# Patient Record
Sex: Male | Born: 1970 | Race: White | Hispanic: Yes | Marital: Married | Smoking: Never smoker
Health system: Southern US, Community
[De-identification: ages and names within clinical notes are randomized; demographics above are authoritative.]

## PROBLEM LIST (undated history)

## (undated) HISTORY — PX: APPENDECTOMY: SHX54

---

## 2003-07-09 ENCOUNTER — Emergency Department (HOSPITAL_COMMUNITY): Admission: EM | Admit: 2003-07-09 | Discharge: 2003-07-09 | Payer: Self-pay | Admitting: Emergency Medicine

## 2003-07-09 ENCOUNTER — Encounter: Payer: Self-pay | Admitting: Emergency Medicine

## 2004-01-17 ENCOUNTER — Emergency Department (HOSPITAL_COMMUNITY): Admission: EM | Admit: 2004-01-17 | Discharge: 2004-01-17 | Payer: Self-pay | Admitting: Emergency Medicine

## 2004-10-11 ENCOUNTER — Emergency Department (HOSPITAL_COMMUNITY): Admission: EM | Admit: 2004-10-11 | Discharge: 2004-10-11 | Payer: Self-pay | Admitting: Emergency Medicine

## 2005-11-08 ENCOUNTER — Emergency Department (HOSPITAL_COMMUNITY): Admission: EM | Admit: 2005-11-08 | Discharge: 2005-11-08 | Payer: Self-pay | Admitting: Emergency Medicine

## 2011-06-09 ENCOUNTER — Emergency Department (HOSPITAL_COMMUNITY)
Admission: EM | Admit: 2011-06-09 | Discharge: 2011-06-09 | Disposition: A | Discharge status: Home / Self Care | Payer: BC Managed Care – PPO | Attending: Emergency Medicine | Admitting: Emergency Medicine

## 2011-06-09 DIAGNOSIS — R112 Nausea with vomiting, unspecified: Secondary | ICD-10-CM | POA: Insufficient documentation

## 2011-06-09 DIAGNOSIS — R42 Dizziness and giddiness: Secondary | ICD-10-CM | POA: Insufficient documentation

## 2011-06-09 DIAGNOSIS — K2970 Gastritis, unspecified, without bleeding: Secondary | ICD-10-CM | POA: Insufficient documentation

## 2011-06-09 DIAGNOSIS — K2990 Gastroduodenitis, unspecified, without bleeding: Secondary | ICD-10-CM | POA: Insufficient documentation

## 2011-06-09 DIAGNOSIS — R61 Generalized hyperhidrosis: Secondary | ICD-10-CM | POA: Insufficient documentation

## 2011-06-09 DIAGNOSIS — R1012 Left upper quadrant pain: Secondary | ICD-10-CM | POA: Insufficient documentation

## 2014-06-09 ENCOUNTER — Emergency Department (HOSPITAL_COMMUNITY)
Admission: EM | Admit: 2014-06-09 | Discharge: 2014-06-10 | Disposition: A | Discharge status: Home / Self Care | Payer: Commercial Managed Care - PPO | Attending: Emergency Medicine | Admitting: Emergency Medicine

## 2014-06-09 ENCOUNTER — Encounter (HOSPITAL_COMMUNITY): Payer: Self-pay | Admitting: Emergency Medicine

## 2014-06-09 DIAGNOSIS — R0789 Other chest pain: Secondary | ICD-10-CM

## 2014-06-09 DIAGNOSIS — Z79899 Other long term (current) drug therapy: Secondary | ICD-10-CM | POA: Insufficient documentation

## 2014-06-09 DIAGNOSIS — H81399 Other peripheral vertigo, unspecified ear: Secondary | ICD-10-CM | POA: Insufficient documentation

## 2014-06-09 LAB — BASIC METABOLIC PANEL
BUN: 14 mg/dL (ref 6–23)
CHLORIDE: 102 meq/L (ref 96–112)
CO2: 26 mEq/L (ref 19–32)
CREATININE: 0.93 mg/dL (ref 0.50–1.35)
Calcium: 10.3 mg/dL (ref 8.4–10.5)
Glucose, Bld: 93 mg/dL (ref 70–99)
POTASSIUM: 4.5 meq/L (ref 3.7–5.3)
Sodium: 140 mEq/L (ref 137–147)

## 2014-06-09 LAB — I-STAT CHEM 8, ED
BUN: 14 mg/dL (ref 6–23)
CALCIUM ION: 1.23 mmol/L (ref 1.12–1.23)
Chloride: 104 mEq/L (ref 96–112)
Creatinine, Ser: 1 mg/dL (ref 0.50–1.35)
Glucose, Bld: 93 mg/dL (ref 70–99)
HEMATOCRIT: 52 % (ref 39.0–52.0)
Hemoglobin: 17.7 g/dL — ABNORMAL HIGH (ref 13.0–17.0)
Potassium: 4.1 mEq/L (ref 3.7–5.3)
Sodium: 141 mEq/L (ref 137–147)
TCO2: 24 mmol/L (ref 0–100)

## 2014-06-09 LAB — CBG MONITORING, ED: GLUCOSE-CAPILLARY: 98 mg/dL (ref 70–99)

## 2014-06-09 NOTE — ED Notes (Signed)
Patient says he has been dizzy for about a day.  He lays down and it gets better.  His dizziness got worse and he started having left rib pain so he decided to come.  The patient denies SOB, N/V, diarrhea, but is complaining of some left rib pain and diaphoresis.  His dizziness improves when he lays down.  The patient came to the ED because he started having rib pain along with the dizziness.

## 2014-06-10 MED ORDER — NAPROXEN 500 MG PO TABS: 500.0000 mg | ORAL_TABLET | Freq: Two times a day (BID) | ORAL | Status: DC

## 2014-06-10 MED ORDER — MECLIZINE HCL 25 MG PO TABS
25.0000 mg | ORAL_TABLET | Freq: Once | ORAL | Status: AC
  Administered 2014-06-10: 25 mg via ORAL
  Filled 2014-06-10: qty 1

## 2014-06-10 MED ORDER — MECLIZINE HCL 25 MG PO TABS: 25.0000 mg | ORAL_TABLET | Freq: Three times a day (TID) | ORAL | Status: DC | PRN

## 2014-06-10 NOTE — Discharge Instructions (Signed)
Vrtigo (Vertigo)  Vrtigo es la sensacin de que se est moviendo estando quieto. Puede ser peligroso si ocurre cuando est trabajado, conduciendo vehculos o realizando actividades difciles.  CAUSAS  El vrtigo se produce cuando hay un conflicto en las seales que se envan al cerebro desde los sistemas visual y sensorial del cuerpo. Hay numerosas causas que [employer], entre las que se incluyen:   Infecciones, especialmente en el odo interno.  Nelia Shi reaccin a un medicamento o mal uso de alcohol y frmacos.  Abstinencia de drogas o alcohol.  Cambios rpidos de posicin, como al [employer] o darse vuelta en la cama.  Dolor de Surveyor, minerals.  Disminucin del flujo sanguneo hacia el cerebro.  Aumento de la presin en el cerebro por un traumatismo, infeccin, tumor o sangrado en la cabeza. SNTOMAS  Puede sentir como si el mundo da vueltas o va a caer al piso. Como hay problemas en el equilibrio, el vrtigo puede causar nuseas y vmitos. Tiene movimientos oculares involuntarios (nistagmus).  DIAGNSTICO  El vrtigo normalmente se diagnostica con un examen fsico. Si la causa no se conoce, el mdico puede indicar diagnstico por imgenes, como una resonancia magntica (imgenes por Health visitor).  TRATAMIENTO  La mayor parte de los casos de vrtigo se resuelve sin TEFL teacher. Segn la causa, el mdico podr recetar ciertos medicamentos. Si se relaciona con la posicin del cuerpo, podr recomendarle movimientos o procedimientos para corregir el problema. En algunos casos raros, si la causa del vrtigo es un problema en el odo interno, necesitar [place].  INSTRUCCIONES PARA EL CUIDADO DOMICILIARIO  Siga las indicaciones del mdico.  Evite conducir vehculos.  Evite operar maquinarias pesadas.  Evite realizar tareas que seran peligrosas para usted u otras personas durante un episodio de vrtigo.  Comunquele al mdico si nota que ciertos medicamentos  parecen asociarse con las crisis. Algunos medicamentos que se usan para tratar los episodios, en [employer]. SOLICITE ATENCIN MDICA DE INMEDIATO SI:  Los medicamentos no [place] las crisis o hacen que estas empeoren.  Tiene dificultad para hablar, caminar, siente debilidad o tiene problemas para [employer], las manos o las piernas.  Comienza a sufrir un dolor de cabeza intenso.  Las nuseas y los vmitos no se [place] o se Press photographer.  Aparecen trastornos visuales.  Un miembro de su familia nota cambios en su conducta.  Hay alguna modificacin en su trastorno que parece Holiday representative de Scientist, clinical (histocompatibility and immunogenetics). ASEGRESE DE QUE:   Comprende estas instrucciones.  Controlar su enfermedad.  Solicitar ayuda de inmediato si no mejora o si empeora. Document Released: 09/25/2005 Document Revised: 03/09/2012 [facility] Patient Information 2014 [place], [place].  Dolor en el pecho (inespecfico) (Chest Pain, Nonspecific) Frecuentemente es difcil dar un diagnstico especfico de la causa de un dolor en el pecho. Siempre existe una posibilidad de que el dolor est relacionado con algo ms grave como un ataque cardaco o un cogulo en los pulmones. Necesitar realizar un seguimiento con el mdico para Catering manager.  CAUSAS  Acidez.  Neumona o bronquitis.  Ansiedad o estrs.  Una inflamacin de la zona que rodea al corazn (pericarditis) o a los pulmones (pleuritis, pleuresa).  Un cogulo sanguneo en el pulmn.  Pulmones colapsados (neumotrax). Puede aparecer de Gus Height repentina por s solo (neumotrax espontneo) o por un traumatismo en el pecho.  Culebrilla (virus del herpes zster). Las paredes del pecho estn compuestas de [place], msculos y Dietitian. Cualquiera de estos puede ser fuente del dolor.  Puede haber una contusin en los huesos debido a una lesin.  Puede haber un esguince en los msculos o el cartlago ocasionado por la tos o un  esfuerzo.  El cartlago tambin puede verse afectado por una inflamacin y Engineer, agricultural (costocondritis). DIAGNSTICO Puede ser necesario realizar anlisis de laboratorio u otros estudios tales como radiografas, Materials engineer, prueba de esfuerzo, o diagnstico por imgenes para el corazn para determinar la causa de su dolor.  TRATAMIENTO  El tratamiento depender de la causa que provoque el dolor en el pecho. El tratamiento pueden incluir:  Bloqueadores de cido para la acidez estomacal.  Medicamentos antiinflamatorios.  Analgsicos para las enfermedades inflamatorias.  Antibiticos si existe infeccin.  Podrn aconsejarle que modifique su estilo de vida. Esto incluye dejar de fumar y evitar el alcohol, la cafena y el chocolate.  Se le aconsejar que duerma con la cabeza levantada [place]). Esto reduce la probabilidad de que el cido vuelva hacia atrs desde el estmago hasta el esfago.  La mayora de las veces, Chief Technology Officer en el pecho no especfico mejora dentro de los 2 a 3 das con reposo y medicamentos para Technical sales engineer. INSTRUCCIONES PARA EL CUIDADO DOMICILIARIO  Si le prescriben antibiticos, tmelos tal como se le indic. Tmelos todos, aunque se sienta mejor.  Durante los [address], evite la actividad fsica que le hace Civil engineer, contracting. Contine con las actividades fsicas tal como se le indic.  No fume.  Evite consumir alcohol.  Slo tome medicamentos de Sales promotion account executive o prescriptos para Primary school teacher, las molestias o bajar la fiebre segn las indicaciones de su mdico.  Siga las indicaciones del profesional que lo asiste para un mayor control si los problemas persisten.  Cumpla con todas las visitas de control programadas. Si no lo hace, podr desarrollar problemas permanentes (crnicos) relacionados con el dolor. Si tiene algn problema para asistir a la cita, debe comunicarse con el establecimiento para obtener asistencia. SOLICITE ATENCIN MDICA  SI:  Tiene problemas que considere que pueden ser efectos secundarios de los medicamentos que toma. Lea con cuidado las recomendaciones para su medicacin.  El dolor en el pecho contina incluso despus de haber seguido el tratamiento.  Observa una erupcin en el pecho, que presenta ampollas. SOLICITE ATENCIN MDICA DE INMEDIATO SI:  El dolor en el pecho aumenta o se extiende al brazo, cuello, mandbula, espalda o abdomen.  Le falta el aire, aumenta la tos o tose y [place].  Tiene dolor intenso en la espalda o el abdomen, nuseas o vmitos.  Presenta debilidad, desmayos o escalofros.  Tiene fiebre. ESTO ES UNA EMERGENCIA. No espere a que el dolor se vaya. Pida ayuda mdica de inmediato. Comunquese con el servicio de urgencias de su localidad (911 en los [place]). No maneje solo [employer]. EST SEGURO QUE:   Comprende las instrucciones para el alta mdica.  Controlar su enfermedad.  Solicitar atencin mdica de inmediato segn las indicaciones. Document Released: 12/16/2005 Document Revised: 03/09/2012 [facility] Patient Information 2014 [place], [place].  Meclizine tablets or capsules Qu es este medicamento? La MECLIZINA es un antihistamnico. Este medicamento se [place] para prevenir nuseas, vmito o sensacin de [employer] asociados con los mareos provocados por el movimiento. Tambin se [place] para tratar o prevenir el vrtigo (mareo intenso o sensacin de que usted o su entorno se mueven o giran). Este medicamento puede ser utilizado para otros usos; si tiene alguna pregunta consulte con su proveedor de atencin mdica o con su farmacutico. MARCAS COMERCIALES DISPONIBLES:  Antivert, Dramamine Less Drowsy, Medivert, Meni-D  Qu le debo informar a mi profesional de la salud antes de tomar este medicamento? Necesita saber si usted presenta alguno de los siguientes problemas o situaciones: -asma -glaucoma -problemas de prstata -problemas  estomacales -problemas urinarios -una reaccin alrgica o inusual a la meclizina, a otros medicamentos, alimentos, colorantes o conservantes -si est embarazada o buscando quedar embarazada -si est amamantando a un beb Cmo debo utilizar este medicamento? Tome este medicamento por va oral con un vaso de agua. Siga las instrucciones de la etiqueta del [place]. Si est tomando este medicamento para evitar los mareos por el movimiento, tome la dosis por lo menos 1 hora antes de Tourist information centre manager. Si el Social worker, tmelo con alimentos o con [place]. Tome sus dosis a intervalos regulares. No tome su medicamento con una frecuencia mayor a la indicada. Hable con su pediatra para informarse acerca del uso de este medicamento en nios. Puede requerir atencin especial. Sobredosis: Pngase en contacto inmediatamente con un centro toxicolgico o una sala de urgencia si usted cree que haya tomado demasiado medicamento. ATENCIN: [employer] es solo para usted. No comparta este medicamento con nadie. Qu sucede si me olvido de una dosis? Si olvida una dosis, tmela lo antes posible. Si es casi la hora de la prxima dosis, tome slo esa dosis. No tome dosis adicionales o dobles. Qu puede interactuar con este medicamento? -barbitricos para inducir el sueo o para el tratamiento de convulsiones -digoxina -medicamentos para la ansiedad o para problemas para conciliar el sueo, tales como el alprazolam, diazepam o temazepam -medicamentos para la fiebre del heno y Education officer, environmental -medicamentos para la depresin mental -medicamentos para movimientos anormales, como los causados por la enfermedad de Occupational hygienist, o para problemas estomacales -analgsicos -medicamentos para International aid/development worker los msculos Puede ser que esta lista no menciona todas las posibles interacciones. Informe a su profesional de [employer] de [employer] productos a base de hierbas, medicamentos de [place] o suplementos  nutritivos que est tomando. Si usted fuma, consume bebidas alcohlicas o si utiliza drogas ilegales, indqueselo tambin a su profesional de [employer]. Algunas sustancias pueden interactuar con su medicamento. A qu debo estar atento al usar [employer]? Si est tomando [employer] forma regular, debe visitar a su mdico o a su profesional de la salud para chequear su evolucin peridicamente. Puede experimentar mareos, somnolencia o visin borrosa. No conduzca ni utilice maquinaria, ni haga nada que Scientist, research (life sciences) en estado de alerta hasta que sepa cmo le afecta este medicamento. No se siente ni se ponga de pie con rapidez, especialmente si es un paciente de edad avanzada. Esto reduce el riesgo de mareos o [employer]. El alcohol puede aumentar los [place]. Evite consumir bebidas alcohlicas. Se le podr secar la boca. Masticar chicle sin azcar, chupar caramelos duros y tomar agua en abundancia le ayudar a mantener la boca hmeda. Si el problema no desaparece o es severo, consulte a su mdico. Este medicamento puede resecarle los ojos y provocar visin borrosa. Si [place] lentes de contacto, puede sentir ciertas molestias. Las gotas lubricantes pueden ayudarle. Si el problema no desaparece o es severo, consulte a su mdico. Qu efectos secundarios puedo tener al [employer] este medicamento? Efectos secundarios que debe informar a su mdico o a Producer, television/film/video de la salud tan pronto como sea posible: -desmayos -pulso cardiaco rpido o irregular Efectos secundarios que, por lo general, no requieren atencin mdica (debe informarlos a su mdico o a Producer, television/film/video  de la salud si persisten o si son molestos): -estreimiento -dificultad para Geographical information systems officerorinar -dificultad para dormir -dolor de cabeza -Programme researcher, broadcasting/film/videomalestar estomacal Puede ser que esta lista no menciona todos los posibles efectos secundarios. Comunquese a su mdico por asesoramiento mdico Hewlett-Packardsobre los efectos secundarios. Usted puede informar los  efectos secundarios a la FDA por telfono al 1-800-FDA-1088. Dnde debo guardar mi medicina? Mantngala fuera del alcance de los nios. Gurdela a Sanmina-SCItemperatura ambiente, entre 15 y 30 grados C ([phone] y [phone] grados F). Mantenga el envase bien cerrado. Deseche los medicamentos que no haya utilizado, despus de la fecha de vencimiento. ATENCIN: Este folleto es un resumen. Puede ser que no cubra toda la posible informacin. Si usted tiene preguntas acerca de esta medicina, consulte con su mdico, su farmacutico o su profesional de Radiographer, therapeuticla salud.  2014, Elsevier/Gold Standard. (2005-03-14 10:35:00)  Naproxen and naproxen sodium oral immediate-release tablets Qu es este medicamento? El NAPROXENO es un medicamento antiinflamatorio no esteroideo (AINE). Se utiliza para reducir la inflamacin y Corporate treasurertratar el dolor. Este medicamento se puede Chemical engineerutilizar para Nurse, adultel dolor dental, dolor de Turkmenistancabeza y perodos menstruales dolorosos. Este medicamento tambin sirve para tratar problemas dolorosos de las articulaciones y los msculos, tales como artritis, tendinitis, bursitis y Secondary school teachergota. Este medicamento puede ser utilizado para otros usos; si tiene alguna pregunta consulte con su proveedor de atencin mdica o con su farmacutico. MARCAS COMERCIALES DISPONIBLES: Aflaxen, Aleve Arthritis, Aleve, All Day Relief, Anaprox DS, Anaprox, Naprosyn Qu le debo informar a mi profesional de la salud antes de tomar este medicamento? Necesita saber si usted presenta alguno de los siguientes problemas o situaciones: -asma -fuma -consume ms de 3 bebidas alcohlicas por da -enfermedad cardiaca o problemas circulatorios, tales como insuficiencia cardiaca o edema de pierna (retencin de lquido) -alta presin sangunea -enfermedad renal -enfermedad heptica -lceras o sangrado estomacal -una reaccin alrgica o inusual al naproxeno, a la aspirina, a otros AINE, otros medicamentos, alimentos, colorantes o conservantes -si est embarazada o  buscando quedar embarazada -si est amamantando a un beb Cmo debo SLM Corporationutilizar este medicamento? Tome este medicamento por va oral con un vaso de agua. Siga las instrucciones de la etiqueta del [place] Citymedicamento. Si este medicamento le produce Programme researcher, broadcasting/film/videomalestar estomacal, tmelo con alimentos. Trate de no acostarse por lo menos 10 minutos despus de tomarlo. Tome sus dosis a intervalos regulares. No tome su medicamento con una frecuencia mayor a la indicada. El uso prolongado puede aumentar el riesgo de sufrir un ataque cardiaco o un derrame cerebral. Su farmacutico le dar una [place] del medicamento especial con cada receta y relleno. Asegrese de leer esta informacin cada vez cuidadosamente. Hable con su pediatra para informarse acerca del uso de este medicamento en nios. Puede requerir atencin especial. Sobredosis: Pngase en contacto inmediatamente con un centro toxicolgico o una sala de urgencia si usted cree que haya tomado demasiado medicamento. ATENCIN: Reynolds AmericanEste medicamento es solo para usted. No comparta este medicamento con nadie. Qu sucede si me olvido de una dosis? Si olvida una dosis, tmela lo antes posible. Si es casi la hora de la prxima dosis, tome slo esa dosis. No tome dosis adicionales o dobles. Qu puede interactuar con este medicamento? -alcohol -aspirina -cidofovir -diurticos -litio -metotrexato -otros medicamentos antiinflamatorios, tales Teacher, English as a foreign languagecomo quetorolac o prednisona -pemetrexed -probenecid -warfarina Puede ser que esta lista no menciona todas las posibles interacciones. Informe a su profesional de Beazer Homesla salud de Ingram Micro Inctodos los productos a base de hierbas, medicamentos de [place] Hillventa libre o suplementos nutritivos que est tomando. Si usted fuma, consume bebidas alcohlicas  o si utiliza drogas ilegales, indqueselo tambin a su profesional de Beazer Homesla salud. Algunas sustancias pueden interactuar con su medicamento. A qu debo estar atento al usar PPL Corporationeste medicamento? Si el dolor no mejora, informe a  su mdico o a su profesional de Beazer Homesla salud. Consulte con su mdico antes de tomar otros analgsicos. No se trate usted mismo. Este medicamento no previene ataques cardiacos o derrames cerebrales. De hecho, este medicamento puede aumentar la posibilidad de Marine scientistpadecer un ataque cardiaco o un derrame cerebral. La posibilidad puede aumentar con el uso prolongado de este medicamento y en pacientes con enfermedad cardiaca. Si est tomando aspirina para la prevencin de ataques cardiacos o derrames cerebrales, comunquese con su mdico o su profesional de Beazer Homesla salud. Evite tomar otros medicamentos que contienen aspirina, ibuprofeno o naproxeno con PPL Corporationeste medicamento. Es probable que se Special educational needs teacherproduzcan efectos secundarios, tales como molestias estomacales, nuseas o lceras. No debe de tomar PPL Corporationeste medicamento con muchos medicamentos disponibles de H. J. Heinzventa libre. Este medicamento puede provocar lceras y hemorragia del estmago e intestinos en cualquier momento durante tratamiento. No fume ni ingiera alcohol. Esto irrita an ms el estmago y puede hacerlo ms susceptible a dao por el uso de PPL Corporationeste medicamento. Pueden ocurrir lceras y hemorragia sin sntomas de Control and instrumentation engineeralerta y Financial controllerpueden provocar la Crenshawmuerte. Puede experimentar mareos o somnolencia. No conduzca ni utilice maquinaria ni haga nada que Scientist, research (life sciences)le exija permanecer en estado de alerta hasta que sepa cmo le afecta este medicamento. No se siente ni se ponga de pie con rapidez, especialmente si es un paciente de edad avanzada. Esto reduce el riesgo de mareos o Newell Rubbermaiddesmayos. Este medicamento puede hacerle sangrar con mayor facilidad. Trate de no lastimarse los dientes y las encas al cepillarlos o limpiarlos con hilo dental. Qu efectos secundarios puedo tener al Boston Scientificutilizar este medicamento? Efectos secundarios que debe informar a su mdico o a Producer, television/film/videosu profesional de la salud tan pronto como sea posible: -heces de color oscuro o con sangre, sangre en la orina o vmito con sangre -visin borrosa -dolor en  el pecho -dificultad al respirar o sibilancias -nuseas o vmito -dolor de estmago severo -erupcin cutnea, enrojecimiento, ampollas o descamacin de la piel, urticarias o picazn -hablar arrastrando las palabras o debilidad en un lado del cuerpo -hinchazn de prpados, garganta o labios -aumento de peso o hinchazn que no tienen explicacin -cansancio o debilidad inusual -color amarillento de los ojos o la piel Efectos secundarios que, por lo general, no requieren atencin mdica (debe informarlos a su mdico o a su profesional de la salud si persisten o si son molestos): -estreimiento -dolor de cabeza -acidez de estmago Puede ser que esta lista no menciona todos los posibles efectos secundarios. Comunquese a su mdico por asesoramiento mdico Hewlett-Packardsobre los efectos secundarios. Usted puede informar los efectos secundarios a la FDA por telfono al 1-800-FDA-1088. Dnde debo guardar mi medicina? Mantngala fuera del alcance de los nios. Gurdela a Sanmina-SCItemperatura ambiente, entre 15 y 30 grados C ([phone] y [phone] grados F). Mantenga le envase bien cerrado. Deseche todo el medicamento que no haya utilizado, despus de la fecha de vencimiento. ATENCIN: Este folleto es un resumen. Puede ser que no cubra toda la posible informacin. Si usted tiene preguntas acerca de esta medicina, consulte con su mdico, su farmacutico o su profesional de Radiographer, therapeuticla salud.  2014, Elsevier/Gold Standard. (2010-01-22 16:00:30)

## 2014-06-10 NOTE — ED Provider Notes (Signed)
CSN: [id number]     Arrival date & time 06/09/14  2031 History   First MD Initiated Contact with Patient 06/10/14 0040     Chief Complaint  Patient presents with  . Dizziness    Patient says he has been dizzy for about a day.  He lays down and it gets better.  His dizziness got worse and he started having left rib pain so he decided to come.     (Consider location/radiation/quality/duration/timing/severity/associated sxs/prior Treatment) Patient is a 43 y.o. male presenting with dizziness. The history is provided by the patient.  Dizziness He comes in with 2 days of dizziness. Dizziness is described as a sense of things moving when he stands up. There is no spinning sensation but there is no lightheadedness. There is no nausea vomiting. He denies hearing loss or ear pain. Symptoms have been stable over that timeframe. He has also had some dull left-sided chest pain. Pain is inframammary and moderate he rates it at 5/10. Nothing makes the pain better nothing makes it worse. He denies any dyspnea or diaphoresis. She's not had any symptoms like this before.  History reviewed. No pertinent past medical history. Past Surgical History  Procedure Laterality Date  . Appendectomy     History reviewed. No pertinent family history. History  Substance Use Topics  . Smoking status: Never Smoker   . Smokeless tobacco: Never Used  . Alcohol Use: No    Review of Systems  Neurological: Positive for dizziness.  All other systems reviewed and are negative.     Allergies  Review of patient's allergies indicates no known allergies.  Home Medications   Prior to Admission medications   Medication Sig Start Date End Date Taking? Authorizing Provider  naproxen (NAPROSYN) 250 MG tablet Take 250 mg by mouth daily.   Yes Historical Provider, MD  Ranitidine HCl (ZANTAC PO) Take 1 tablet by mouth 2 (two) times daily.   Yes Historical Provider, MD   BP 125/84  Pulse 63  Temp(Src) 98.5 F (36.9 C)  (Oral)  Resp 19  Ht 5\' 5"  (1.651 m)  Wt 266 lb (120.657 kg)  BMI 44.26 kg/m2  SpO2 99% Physical Exam  Nursing note and vitals reviewed.  43 year old male, resting comfortably and in no acute distress. Vital signs are normal. Oxygen saturation is 99%, which is normal. Head is normocephalic and atraumatic. PERRLA, EOMI. Oropharynx is clear. TMs are clear. Neck is nontender and supple without adenopathy or JVD. Back is nontender and there is no CVA tenderness. Lungs are clear without rales, wheezes, or rhonchi. Chest is nontender. Heart has regular rate and rhythm without murmur. Abdomen is soft, flat, nontender without masses or hepatosplenomegaly and peristalsis is normoactive. Extremities have no cyanosis or edema, full range of motion is present. Skin is warm and dry without rash. Neurologic: Mental status is normal, cranial nerves are intact, there are no motor or sensory deficits. There is partial reproduction of dizziness with head movement.  ED Course  Procedures (including critical care time) Labs Review Results for orders placed during the hospital encounter of 06/09/14  BASIC METABOLIC PANEL      Result Value Ref Range   Sodium 140  137 - 147 mEq/L   Potassium 4.5  3.7 - 5.3 mEq/L   Chloride 102  96 - 112 mEq/L   CO2 26  19 - 32 mEq/L   Glucose, Bld 93  70 - 99 mg/dL   BUN 14  6 - 23 mg/dL  Creatinine, Ser 0.93  0.50 - 1.35 mg/dL   Calcium 30.810.3  8.4 - 65.710.5 mg/dL   GFR calc non Af Amer >90  >90 mL/min   GFR calc Af Amer >90  >90 mL/min  CBG MONITORING, ED      Result Value Ref Range   Glucose-Capillary 98  70 - 99 mg/dL  I-STAT CHEM 8, ED      Result Value Ref Range   Sodium 141  137 - 147 mEq/L   Potassium 4.1  3.7 - 5.3 mEq/L   Chloride 104  96 - 112 mEq/L   BUN 14  6 - 23 mg/dL   Creatinine, Ser 8.461.00  0.50 - 1.35 mg/dL   Glucose, Bld 93  70 - 99 mg/dL   Calcium, Ion 9.621.23  9.521.12 - 1.23 mmol/L   TCO2 24  0 - 100 mmol/L   Hemoglobin 17.7 (*) 13.0 - 17.0 g/dL    HCT 84.152.0  32.439.0 - 40.152.0 %    MDM   Final diagnoses:  Peripheral vertigo  Muscular chest pain    Dizziness which seems most likely to be mild peripheral vertigo. He'll be given therapeutic trial of meclizine. Electrolytes are normal. Orthostatic vital signs will be obtained as well as ECG.  He got good relief of dizziness with meclizine. Workup is unremarkable other than polycythemia. His discharge with prescriptions for meclizine and also naproxen.  Dione Boozeavid Shaquoya Cosper, MD 06/10/14 [phone]

## 2014-06-10 NOTE — ED Notes (Signed)
Pt continues to c/o pain in left rib area.  Pt resting and txting on phone at this time.  Cardiac monitor NSR

## 2017-08-26 ENCOUNTER — Emergency Department (HOSPITAL_COMMUNITY)
Admission: EM | Admit: 2017-08-26 | Discharge: 2017-08-26 | Disposition: A | Discharge status: Home / Self Care | Payer: Commercial Managed Care - PPO | Attending: Emergency Medicine | Admitting: Emergency Medicine

## 2017-08-26 ENCOUNTER — Encounter (HOSPITAL_COMMUNITY): Payer: Self-pay | Admitting: Emergency Medicine

## 2017-08-26 DIAGNOSIS — K0889 Other specified disorders of teeth and supporting structures: Secondary | ICD-10-CM

## 2017-08-26 DIAGNOSIS — Z79899 Other long term (current) drug therapy: Secondary | ICD-10-CM | POA: Insufficient documentation

## 2017-08-26 MED ORDER — IBUPROFEN 800 MG PO TABS: 800.0000 mg | ORAL_TABLET | Freq: Three times a day (TID) | ORAL | 0 refills | Status: DC

## 2017-08-26 MED ORDER — OXYCODONE-ACETAMINOPHEN 5-325 MG PO TABS
ORAL_TABLET | ORAL | Status: AC
  Filled 2017-08-26: qty 1

## 2017-08-26 MED ORDER — AMOXICILLIN 500 MG PO CAPS: 500.0000 mg | ORAL_CAPSULE | Freq: Three times a day (TID) | ORAL | 0 refills | Status: DC

## 2017-08-26 MED ORDER — OXYCODONE-ACETAMINOPHEN 5-325 MG PO TABS
1.0000 | ORAL_TABLET | ORAL | Status: AC | PRN
  Administered 2017-08-26 (×2): 1 via ORAL
  Filled 2017-08-26: qty 1

## 2017-08-26 NOTE — ED Triage Notes (Signed)
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

## 2017-08-26 NOTE — ED Provider Notes (Signed)
[facility] Provider Note   CSN: [id number] Arrival date & time: 08/26/17  2141     History   Chief Complaint Chief Complaint  Patient presents with  . Dental Pain  . Facial Pain    HPI Dustin Mcgrath is a 46 y.o. male.  Patient complaining of pain to right lateral incisor, onset two days ago. Denies fever/chills.   The history is provided by the patient. No language interpreter was used.  Dental Pain   This is a recurrent problem. The current episode started 2 days ago. The problem has been gradually worsening. The pain is moderate. Treatments tried: aleve. The treatment provided no relief.    History reviewed. No pertinent past medical history.  There are no active problems to display for this patient.   Past Surgical History:  Procedure Laterality Date  . APPENDECTOMY         Home Medications    Prior to Admission medications   Medication Sig Start Date End Date Taking? Authorizing Provider  meclizine (ANTIVERT) 25 MG tablet Take 1 tablet (25 mg total) by mouth 3 (three) times daily as needed for dizziness. 06/10/14   Dione Booze, MD  naproxen (NAPROSYN) 250 MG tablet Take 250 mg by mouth daily.    [provider]  naproxen (NAPROSYN) 500 MG tablet Take 1 tablet (500 mg total) by mouth 2 (two) times daily. 06/10/14   Dione Booze, MD  Ranitidine HCl (ZANTAC PO) Take 1 tablet by mouth 2 (two) times daily.    [provider]    Family History History reviewed. No pertinent family history.  Social History Social History  Substance Use Topics  . Smoking status: Never Smoker  . Smokeless tobacco: Never Used  . Alcohol use No     Allergies   Patient has no known allergies.   Review of Systems Review of Systems  Constitutional: Negative for fever.  HENT: Positive for dental problem and ear pain.   All other systems reviewed and are negative.    Physical Exam Updated Vital Signs BP (!) 145/103 (BP Location: Left Arm)    Pulse 72   Temp 97.8 F (36.6 C) (Oral)   Resp 18   Ht 5\' 5"  (1.651 m)   Wt 116.1 kg (256 lb)   SpO2 97%   BMI 42.60 kg/m   Physical Exam  Constitutional: He is oriented to person, place, and time. He appears well-developed and well-nourished.  HENT:  Head: Normocephalic.  Mouth/Throat: Oropharynx is clear and moist and mucous membranes are normal. No trismus in the jaw.    Eyes: Conjunctivae are normal.  Neck: Neck supple.  Cardiovascular: Normal rate and regular rhythm.   Pulmonary/Chest: Effort normal and breath sounds normal.  Abdominal: Soft. Bowel sounds are normal.  Musculoskeletal: Normal range of motion.  Lymphadenopathy:    He has no cervical adenopathy.  Neurological: He is alert and oriented to person, place, and time.  Skin: Skin is warm and dry.  Psychiatric: He has a normal mood and affect.  Nursing note and vitals reviewed.    ED Treatments / Results  Labs (all labs ordered are listed, but only abnormal results are displayed) Labs Reviewed - No data to display  EKG  EKG Interpretation None       Radiology No results found.  Procedures Procedures (including critical care time)  Medications Ordered in ED Medications  oxyCODONE-acetaminophen (PERCOCET/ROXICET) 5-325 MG per tablet 1 tablet (1 tablet Oral Given 08/26/17 2152)  oxyCODONE-acetaminophen (PERCOCET/ROXICET) 5-325 MG  per tablet (not administered)     Initial Impression / Assessment and Plan / ED Course  I have reviewed the triage vital signs and the nursing notes.  Pertinent labs & imaging results that were available during my care of the patient were reviewed by me and considered in my medical decision making (see chart for details).     Patient with dentalgia.  No abscess requiring immediate incision and drainage.  Exam not concerning for Ludwig's angina or pharyngeal abscess.  Will treat with amoxicillin and ibuprofen 800/tylenol 975. Pt instructed to follow-up with dentist.   Discussed return precautions. Pt safe for discharge.  Final Clinical Impressions(s) / ED Diagnoses   Final diagnoses:  Pain, dental    New Prescriptions New Prescriptions   AMOXICILLIN (AMOXIL) 500 MG CAPSULE    Take 1 capsule (500 mg total) by mouth 3 (three) times daily.   IBUPROFEN (ADVIL,MOTRIN) 800 MG TABLET    Take 1 tablet (800 mg total) by mouth 3 (three) times daily.     Felicie Morn, NP 08/26/17 9407    Pricilla Loveless, MD 08/27/17 [phone]

## 2017-08-26 NOTE — Discharge Instructions (Signed)
Take a combination of 800 mg ibuprofen and 975 mg tylenol (three 325 mg tabs) every 8 hours as needed for pain. Take the antibiotic as directed. Follow-up with your dentist.

## 2018-04-09 ENCOUNTER — Ambulatory Visit (INDEPENDENT_AMBULATORY_CARE_PROVIDER_SITE_OTHER): Payer: Self-pay

## 2018-04-09 ENCOUNTER — Encounter (HOSPITAL_COMMUNITY): Payer: Self-pay | Admitting: Emergency Medicine

## 2018-04-09 ENCOUNTER — Ambulatory Visit (HOSPITAL_COMMUNITY)
Admission: EM | Admit: 2018-04-09 | Discharge: 2018-04-09 | Disposition: A | Discharge status: Home / Self Care | Payer: Self-pay | Attending: Internal Medicine | Admitting: Internal Medicine

## 2018-04-09 DIAGNOSIS — R0789 Other chest pain: Secondary | ICD-10-CM

## 2018-04-09 MED ORDER — MELOXICAM 7.5 MG PO TABS: 7.5000 mg | ORAL_TABLET | Freq: Every day | ORAL | 0 refills | Status: DC

## 2018-04-09 MED ORDER — DOCUSATE SODIUM 50 MG PO CAPS: 50.0000 mg | ORAL_CAPSULE | Freq: Two times a day (BID) | ORAL | 0 refills | Status: DC

## 2018-04-09 MED ORDER — POLYETHYLENE GLYCOL 3350 17 G PO PACK: 17.0000 g | PACK | Freq: Every day | ORAL | 0 refills | Status: DC

## 2018-04-09 NOTE — ED Triage Notes (Signed)
Pt c/o L lower chest pain x3 months, denies pain at this time. Pressed on patients L chest and he stated that it was tender there but denies pain at rest.

## 2018-04-09 NOTE — ED Provider Notes (Signed)
[facility]    CSN: [id number] Arrival date & time: 04/09/18  1532     History   Chief Complaint Chief Complaint  Patient presents with  . Chest Pain    HPI Dustin Mcgrath is a 47 y.o. male.   47 year old male comes in for 3 month history of left sided chest pain. States pain is intermittent, lasts from seconds to minutes. Patient drives truck for a living and states that pain is usually worse or more often during the long drives and sitting down. Pain usually occurs at rest. He denies any shortness of breath, trouble breathing, palpitations. Denies nausea/vomiting, diaphoresis. Denies weakness, dizziness, syncope. Denies worsening with food intake, states that pain is different from acid reflux, which he takes zantac for. Lives sedentary life style without much exertion. No obvious anginal symptoms. Denies personal history of HTN, DM, heart disease. Denies family history of heart disease. Rare alcohol use. Denies illicit drug use. Never smoker. Denies current pain.  Denies abdominal pain.  Bowel movements and small amounts twice a day.  Denies straining.     History reviewed. No pertinent past medical history.  There are no active problems to display for this patient.   Past Surgical History:  Procedure Laterality Date  . APPENDECTOMY         Home Medications    Prior to Admission medications   Medication Sig Start Date End Date Taking? Authorizing Provider  amoxicillin (AMOXIL) 500 MG capsule Take 1 capsule (500 mg total) by mouth 3 (three) times daily. Patient not taking: Reported on 04/09/2018 08/26/17   Felicie Morn, NP  docusate sodium (COLACE) 50 MG capsule Take 1 capsule (50 mg total) by mouth 2 (two) times daily. 04/09/18   Cathie Hoops, Quynn Vilchis V, PA-C  ibuprofen (ADVIL,MOTRIN) 800 MG tablet Take 1 tablet (800 mg total) by mouth 3 (three) times daily. 08/26/17   Felicie Morn, NP  meclizine (ANTIVERT) 25 MG tablet Take 1 tablet (25 mg total) by mouth 3 (three) times  daily as needed for dizziness. Patient not taking: Reported on 04/09/2018 06/10/14   Dione Booze, MD  meloxicam (MOBIC) 7.5 MG tablet Take 1 tablet (7.5 mg total) by mouth daily. 04/09/18   Cathie Hoops, Adelle Zachar V, PA-C  naproxen (NAPROSYN) 250 MG tablet Take 250 mg by mouth daily.    [provider]  naproxen (NAPROSYN) 500 MG tablet Take 1 tablet (500 mg total) by mouth 2 (two) times daily. 06/10/14   Dione Booze, MD  polyethylene glycol [facility]) packet Take 17 g by mouth daily. 04/09/18   Cathie Hoops, Caiya Bettes V, PA-C  Ranitidine HCl (ZANTAC PO) Take 1 tablet by mouth 2 (two) times daily.    [provider]    Family History No family history on file.  Social History Social History   Tobacco Use  . Smoking status: Never Smoker  . Smokeless tobacco: Never Used  Substance Use Topics  . Alcohol use: No  . Drug use: No     Allergies   Patient has no known allergies.   Review of Systems Review of Systems  Reason unable to perform ROS: See HPI as above.     Physical Exam Triage Vital Signs ED Triage Vitals [04/09/18 1618]  Enc Vitals Group     BP (!) 153/99     Pulse Rate 71     Resp 18     Temp 98.1 F (36.7 C)     Temp src      SpO2 97 %  Weight      Height      Head Circumference      Peak Flow      Pain Score 0     Pain Loc      Pain Edu?      Excl. in GC?    No data found.  Updated Vital Signs BP (!) 153/99   Pulse 71   Temp 98.1 F (36.7 C)   Resp 18   SpO2 97%   Physical Exam  Constitutional: He is oriented to person, place, and time. He appears well-developed and well-nourished.  Non-toxic appearance. He does not appear ill. No distress.  HENT:  Head: Normocephalic and atraumatic.  Eyes: Pupils are equal, round, and reactive to light. EOM are normal.  Neck: Normal range of motion. Neck supple.  Cardiovascular: Regular rhythm. Exam reveals no gallop and no friction rub.  No murmur heard. Pulmonary/Chest: Effort normal and breath sounds normal. No  accessory muscle usage or stridor. No respiratory distress. He has no decreased breath sounds. He has no wheezes. He has no rhonchi. He has no rales.  No rashes seen. Tenderness to palpation of left lower ribs.   Abdominal:  Exam limited due to body habitus. No tenderness to palpation of abdomen. No guarding or rebound.   Musculoskeletal:       Right lower leg: He exhibits no edema.       Left lower leg: He exhibits no edema.  Neurological: He is alert and oriented to person, place, and time.  Skin: Skin is warm and dry.  Psychiatric: He has a normal mood and affect. His behavior is normal.     UC Treatments / Results  Labs (all labs ordered are listed, but only abnormal results are displayed) Labs Reviewed - No data to display  EKG None Radiology Dg Chest 2 View  Result Date: 04/09/2018 CLINICAL DATA:  47 y/o  M; 2 months of chest pain with cough. EXAM: CHEST - 2 VIEW COMPARISON:  None. FINDINGS: Low lung volumes accentuate pulmonary markings. Mild enlarged cardiac silhouette may be due to low lung volumes. Ill-defined opacities in lung bases may represent atelectasis or bronchitic changes. No pleural effusion or pneumothorax. Bones are unremarkable. IMPRESSION: Low lung volumes. Ill-defined basilar opacities may represent bronchitic changes or atelectasis. Electronically Signed   By: Mitzi HansenLance  Furusawa-Stratton M.D.   On: 04/09/2018 18:10    Procedures Procedures (including critical care time)  Medications Ordered in UC Medications - No data to display   Initial Impression / Assessment and Plan / UC Course  I have reviewed the triage vital signs and the nursing notes.  Pertinent labs & imaging results that were available during my care of the patient were reviewed by me and considered in my medical decision making (see chart for details).    Discussed chest x-ray results with patient.  EKG Normal sinus rhythm, 61 bpm, no acute ST changes compared to last EKG in 2015. Patient  without current chest pain.  Pain reproducible.  He is without tachycardia, tachypnea, shortness of breath, weakness, dizziness, in no acute distress.  Will start Mobic as directed for MSK causes of symptoms. Will have patient follow up with PCP for further evaluation and management needed. Strict return precautions given. Patient expresses understanding and agrees to plan.   Final Clinical Impressions(s) / UC Diagnoses   Final diagnoses:  Atypical chest pain    ED Discharge Orders        Ordered    meloxicam (  MOBIC) 7.5 MG tablet  Daily     04/09/18 1827    docusate sodium (COLACE) 50 MG capsule  2 times daily     04/09/18 1833    polyethylene glycol (MIRALAX) packet  Daily     04/09/18 1833        Belinda Fisher, PA-C 04/09/18 1838

## 2018-04-09 NOTE — Discharge Instructions (Addendum)
Start mobic as directed. As discussed, abnormal bowel movement could also be causing symptoms. Take colace to help with bowel movement. Miralax to maintain bowel movement. Follow up with PCP for further evaluation and management needed. If experiencing worsening symptoms, chest pain with activity, shortness of breath, wheezing, dizziness, weakness, passing out, go to the emergency department for further evaluation needed.

## 2019-10-21 ENCOUNTER — Encounter (HOSPITAL_COMMUNITY): Payer: Self-pay | Admitting: Emergency Medicine

## 2019-10-21 ENCOUNTER — Emergency Department (HOSPITAL_COMMUNITY): Payer: Self-pay

## 2019-10-21 ENCOUNTER — Other Ambulatory Visit: Payer: Self-pay

## 2019-10-21 ENCOUNTER — Emergency Department (HOSPITAL_COMMUNITY)
Admission: EM | Admit: 2019-10-21 | Discharge: 2019-10-22 | Disposition: A | Discharge status: Home / Self Care | Payer: Self-pay | Attending: Emergency Medicine | Admitting: Emergency Medicine

## 2019-10-21 ENCOUNTER — Emergency Department (HOSPITAL_COMMUNITY)
Admission: EM | Admit: 2019-10-21 | Discharge: 2019-10-21 | Disposition: A | Discharge status: Home / Self Care | Payer: Self-pay | Attending: Emergency Medicine | Admitting: Emergency Medicine

## 2019-10-21 DIAGNOSIS — R109 Unspecified abdominal pain: Secondary | ICD-10-CM | POA: Insufficient documentation

## 2019-10-21 DIAGNOSIS — R1011 Right upper quadrant pain: Secondary | ICD-10-CM | POA: Insufficient documentation

## 2019-10-21 DIAGNOSIS — R112 Nausea with vomiting, unspecified: Secondary | ICD-10-CM | POA: Insufficient documentation

## 2019-10-21 DIAGNOSIS — Z5321 Procedure and treatment not carried out due to patient leaving prior to being seen by health care provider: Secondary | ICD-10-CM | POA: Insufficient documentation

## 2019-10-21 LAB — COMPREHENSIVE METABOLIC PANEL
ALT: 26 U/L (ref 0–44)
AST: 19 U/L (ref 15–41)
Albumin: 4.3 g/dL (ref 3.5–5.0)
Alkaline Phosphatase: 62 U/L (ref 38–126)
Anion gap: 10 (ref 5–15)
BUN: 11 mg/dL (ref 6–20)
CO2: 28 mmol/L (ref 22–32)
Calcium: 9.8 mg/dL (ref 8.9–10.3)
Chloride: 101 mmol/L (ref 98–111)
Creatinine, Ser: 1.07 mg/dL (ref 0.61–1.24)
GFR calc Af Amer: >60 mL/min (ref >60)
GFR calc non Af Amer: >60 mL/min (ref >60)
Glucose, Bld: 150 mg/dL — ABNORMAL HIGH (ref 70–99)
Potassium: 4 mmol/L (ref 3.5–5.1)
Sodium: 139 mmol/L (ref 135–145)
Total Bilirubin: 0.9 mg/dL (ref 0.3–1.2)
Total Protein: 7.2 g/dL (ref 6.5–8.1)

## 2019-10-21 LAB — CBC
HCT: 50.3 % (ref 39.0–52.0)
Hemoglobin: 16 g/dL (ref 13.0–17.0)
MCH: 27.9 pg (ref 26.0–34.0)
MCHC: 31.8 g/dL (ref 30.0–36.0)
MCV: 87.8 fL (ref 80.0–100.0)
Platelets: 215 10*3/uL (ref 150–400)
RBC: 5.73 MIL/uL (ref 4.22–5.81)
RDW: 13.9 % (ref 11.5–15.5)
WBC: 6.7 10*3/uL (ref 4.0–10.5)
nRBC: 0 % (ref 0.0–0.2)

## 2019-10-21 LAB — LIPASE, BLOOD: Lipase: 32 U/L (ref 11–51)

## 2019-10-21 MED ORDER — SODIUM CHLORIDE 0.9% FLUSH: 3.0000 mL | Freq: Once | INTRAVENOUS | Status: DC

## 2019-10-21 MED ORDER — FENTANYL CITRATE (PF) 100 MCG/2ML IJ SOLN
100.0000 ug | Freq: Once | INTRAMUSCULAR | Status: AC
  Administered 2019-10-22: 01:00:00 100 ug via INTRAVENOUS
  Filled 2019-10-21: qty 2

## 2019-10-21 NOTE — ED Triage Notes (Signed)
Pt LWBS earlier today for abdominal pain and vomiting, went to PCP who sent him back here for CT abdomen, likely gallbladder (has had appendectomy).

## 2019-10-21 NOTE — ED Notes (Signed)
Delay explained. Pt is ambulatory.  Pt states that his pain is less

## 2019-10-21 NOTE — ED Provider Notes (Signed)
[facility] Provider Note   CSN: [id number] Arrival date & time: 10/21/19  1258     History   Chief Complaint Chief Complaint  Patient presents with  . Abdominal Pain    HPI Dustin Mcgrath is a 48 y.o. male.     The history is provided by the patient.  Abdominal Pain Pain location:  Generalized Pain quality: aching   Pain severity:  Moderate Onset quality:  Gradual Duration:  1 day Timing:  Constant Progression:  Worsening Chronicity:  New Relieved by:  None tried Worsened by:  Movement and palpation Associated symptoms: nausea and vomiting   Associated symptoms: no chest pain, no cough, no diarrhea, no dysuria, no fever and no shortness of breath   Patient reports onset of abdominal pain after waking up.  He reports it is mostly in his abdomen, but has had some right back pain.  No fevers.  He has had vomiting.  No dysuria. He does report he had pain in his groin several hours ago but that is improved. He has not had this pain before.  He has had previous appendectomy  PMH-none Past Surgical History:  Procedure Laterality Date  . APPENDECTOMY          Home Medications    Prior to Admission medications   Medication Sig Start Date End Date Taking? Authorizing Provider  Ranitidine HCl (ZANTAC PO) Take 1 tablet by mouth 2 (two) times daily.    [provider]    Family History No family history on file.  Social History Social History   Tobacco Use  . Smoking status: Never Smoker  . Smokeless tobacco: Never Used  Substance Use Topics  . Alcohol use: No  . Drug use: No     Allergies   Patient has no known allergies.   Review of Systems Review of Systems  Constitutional: Negative for fever.  Respiratory: Negative for cough and shortness of breath.   Cardiovascular: Negative for chest pain.  Gastrointestinal: Positive for abdominal pain, nausea and vomiting. Negative for diarrhea.  Genitourinary:  Positive for flank pain. Negative for dysuria and testicular pain.  All other systems reviewed and are negative.    Physical Exam Updated Vital Signs BP 129/72 (BP Location: Left Arm)   Pulse 81   Temp 99.5 F (37.5 C) (Oral)   Resp 20   SpO2 97%   Physical Exam CONSTITUTIONAL: Well developed/well nourished HEAD: Normocephalic/atraumatic EYES: EOMI/PERRL, no icterus ENMT: Mucous membranes moist NECK: supple no meningeal signs SPINE/BACK:entire spine nontender CV: S1/S2 noted, no murmurs/rubs/gallops noted LUNGS: Lungs are clear to auscultation bilaterally, no apparent distress ABDOMEN: soft, moderate RUQ tenderness, no rebound or guarding, bowel sounds noted throughout abdomen GU:no cva tenderness NEURO: Pt is awake/alert/appropriate, moves all extremitiesx4.  No facial droop.   EXTREMITIES: pulses normal/equal, full ROM SKIN: warm, color normal PSYCH: no abnormalities of mood noted, alert and oriented to situation   ED Treatments / Results  Labs (all labs ordered are listed, but only abnormal results are displayed) Labs Reviewed  URINALYSIS, ROUTINE W REFLEX MICROSCOPIC - Abnormal; Notable for the following components:      Result Value   Hgb urine dipstick MODERATE (*)    Bacteria, UA RARE (*)    All other components within normal limits    EKG\ED ECG REPORT   Date: 10/21/2019 0955am  Rate: 72  Rhythm: normal sinus rhythm  QRS Axis: left  Intervals: normal  ST/T Wave abnormalities: normal  Conduction Disutrbances:none  I have personally reviewed the EKG tracing and agree with the computerized printout as noted.  Radiology US Abdomen Limited Ruq  Result Date: 10/22/2019 CLINICAL DATA:  Right upper quadrant pain. EXAM: ULTRASOUND ABDOMEN LIMITED RIGHT UPPER QUADRANT COMPARISON:  None. FINDINGS: Examination is technically limited due to body habitus and poor penetration. Gallbladder: Physiologically distended. No gallstones or wall thickening visualized.  There is a 6 mm polyp from the fundus. No sonographic Murphy sign noted by sonographer. Common bile duct: Diameter: 4 mm. Liver: Heterogeneously increased parenchymal echogenicity. The liver parenchyma is difficult to penetrate. No visualized focal lesion. Portal vein is patent on color Doppler imaging with normal direction of blood flow towards the liver. Other: No ascites. IMPRESSION: 1. Small gallbladder polyp. No dedicated imaging follow-up is needed due to size. No gallstones or sonographic findings of acute cholecystitis. No biliary dilatation. 2. Hepatic steatosis. The liver parenchyma is difficult to penetrate. Electronically Signed   By: Narda Rutherford M.D.   On: 10/22/2019 00:08    Procedures Procedures  Medications Ordered in ED Medications  fentaNYL (SUBLIMAZE) injection 100 mcg (100 mcg Intravenous Given 10/22/19 0031)     Initial Impression / Assessment and Plan / ED Course  I have reviewed the triage vital signs and the nursing notes.  Pertinent labs & imaging results that were available during my care of the patient were reviewed by me and considered in my medical decision making (see chart for details).        11:40 PM Patient came to the ER earlier today and left due to wait time.  Labs and EKG from that episode have been reviewed and unremarkable.  Patient did have a temperature elevation earlier that is improving.  He reports his pain is improved, but he has now localized to the RUQ.  Will obtain abdominal ultrasound.  Will check urinalysis 1:36 AM Patient feels improved While in the emergency department, he urinated and a stone was passed in his urine.  He also has hematuria.  With this evidence at his recent history, suspicious he had a ureteral stone that has passed. Ultrasound imaging is negative for cholecystitis/cholelithiasis Patient at baseline. Will discharge home. Patient well-appearing and appropriate for discharge Final Clinical Impressions(s) / ED  Diagnoses   Final diagnoses:  RUQ pain  Right upper quadrant abdominal pain  Flank pain    ED Discharge Orders    None       Zadie Rhine, MD 10/22/19 0139

## 2019-10-21 NOTE — ED Triage Notes (Signed)
Pt reports upper and lower abd pain that started this morning. Pt reports 2 bouts of vomiting.

## 2019-10-21 NOTE — ED Notes (Signed)
Jacelyn Pi (Sister# [phone]) called for an update.

## 2019-10-21 NOTE — ED Notes (Signed)
Pt family said that they are leaving. Pt walked outside. Notified sort nurse.

## 2019-10-22 LAB — URINALYSIS, ROUTINE W REFLEX MICROSCOPIC
Bilirubin Urine: NEGATIVE
Glucose, UA: NEGATIVE mg/dL
Ketones, ur: NEGATIVE mg/dL
Leukocytes,Ua: NEGATIVE
Nitrite: NEGATIVE
Protein, ur: NEGATIVE mg/dL
Specific Gravity, Urine: 1.015 (ref 1.005–1.030)
pH: 6 (ref 5.0–8.0)

## 2021-04-11 ENCOUNTER — Encounter (HOSPITAL_COMMUNITY): Payer: Self-pay

## 2021-04-11 ENCOUNTER — Ambulatory Visit (HOSPITAL_COMMUNITY)
Admission: EM | Admit: 2021-04-11 | Discharge: 2021-04-11 | Disposition: A | Discharge status: Home / Self Care | Payer: 59 | Attending: Physician Assistant | Admitting: Physician Assistant

## 2021-04-11 ENCOUNTER — Other Ambulatory Visit: Payer: Self-pay

## 2021-04-11 ENCOUNTER — Ambulatory Visit (INDEPENDENT_AMBULATORY_CARE_PROVIDER_SITE_OTHER): Payer: 59

## 2021-04-11 DIAGNOSIS — G8929 Other chronic pain: Secondary | ICD-10-CM | POA: Diagnosis not present

## 2021-04-11 DIAGNOSIS — M79602 Pain in left arm: Secondary | ICD-10-CM

## 2021-04-11 DIAGNOSIS — M25512 Pain in left shoulder: Secondary | ICD-10-CM

## 2021-04-11 DIAGNOSIS — R030 Elevated blood-pressure reading, without diagnosis of hypertension: Secondary | ICD-10-CM

## 2021-04-11 MED ORDER — NAPROXEN 375 MG PO TABS: 375.0000 mg | ORAL_TABLET | Freq: Two times a day (BID) | ORAL | 0 refills | Status: DC

## 2021-04-11 NOTE — ED Provider Notes (Signed)
[facility]    CSN: [id number] Arrival date & time: 04/11/21  0946      History   Chief Complaint Chief Complaint  Patient presents with  . Arm Pain    left    HPI Dustin Mcgrath is a 50 y.o. male.   Patient presents today with a several month history of left shoulder pain.  He denies known injury or increase in activity prior to symptom onset.  Reports symptoms began soon after receiving [employer] COVID-19 vaccine.  Reports some swelling but that states this has resolved.  Pain is rated 7 on a 0-10 pain scale, localized to left anterior shoulder with radiation throughout arm, described as aching periodic sharp pains, worse with certain movements, no alleviating factors identified.  He has tried Aleve without improvement of symptoms.  Denies previous injury or surgery to the shoulder.  He denies any numbness, tingling, erythema, chest pain, shortness of breath.  He is ambidextrous but uses his left hand a lot therefore symptoms are interfering with ability perform daily activities.  He has not been evaluated for this in the past.  He does not have a PCP.     History reviewed. No pertinent past medical history.  There are no problems to display for this patient.   Past Surgical History:  Procedure Laterality Date  . APPENDECTOMY         Home Medications    Prior to Admission medications   Medication Sig Start Date End Date Taking? Authorizing Provider  naproxen (NAPROSYN) 375 MG tablet Take 1 tablet (375 mg total) by mouth 2 (two) times daily. 04/11/21  Yes Azana Kiesler K, PA-C  Ranitidine HCl (ZANTAC PO) Take 1 tablet by mouth 2 (two) times daily.    [provider]    Family History History reviewed. No pertinent family history.  Social History Social History   Tobacco Use  . Smoking status: Never Smoker  . Smokeless tobacco: Never Used  Substance Use Topics  . Alcohol use: No  . Drug use: No     Allergies   Patient has no known  allergies.   Review of Systems Review of Systems  Constitutional: Positive for activity change. Negative for appetite change, fatigue and fever.  Respiratory: Negative for cough and shortness of breath.   Cardiovascular: Negative for chest pain, palpitations and leg swelling.  Musculoskeletal: Positive for arthralgias (left shoulder). Negative for back pain, myalgias and neck pain.  Neurological: Positive for headaches (intermittent). Negative for dizziness, weakness, light-headedness and numbness.     Physical Exam Triage Vital Signs ED Triage Vitals  Enc Vitals Group     BP 04/11/21 1003 (!) 158/92     Pulse Rate 04/11/21 1003 83     Resp 04/11/21 1003 17     Temp 04/11/21 1003 98.8 F (37.1 C)     Temp Source 04/11/21 1003 Oral     SpO2 04/11/21 1003 97 %     Weight --      Height --      Head Circumference --      Peak Flow --      Pain Score 04/11/21 1001 6     Pain Loc --      Pain Edu? --      Excl. in GC? --    No data found.  Updated Vital Signs BP (!) 158/92 (BP Location: Left Arm)   Pulse 83   Temp 98.8 F (37.1 C) (Oral)   Resp 17  SpO2 97%   Visual Acuity Right Eye Distance:   Left Eye Distance:   Bilateral Distance:    Right Eye Near:   Left Eye Near:    Bilateral Near:     Physical Exam Vitals reviewed.  Constitutional:      General: He is awake.     Appearance: Normal appearance. He is normal weight. He is not ill-appearing.     Comments: Very pleasant male appears stated age in no acute distress  HENT:     Head: Normocephalic and atraumatic.  Cardiovascular:     Rate and Rhythm: Normal rate and regular rhythm.     Pulses:          Radial pulses are 2+ on the right side and 2+ on the left side.     Heart sounds: No murmur heard.   Pulmonary:     Effort: Pulmonary effort is normal.     Breath sounds: Normal breath sounds. No stridor. No wheezing, rhonchi or rales.     Comments: Clear to auscultation bilaterally Abdominal:      General: Bowel sounds are normal.     Palpations: Abdomen is soft.     Tenderness: There is no abdominal tenderness.  Musculoskeletal:     Left shoulder: Tenderness present. No swelling or bony tenderness. Decreased range of motion. Normal strength.     Cervical back: Normal range of motion and neck supple. No spinous process tenderness or muscular tenderness.     Right lower leg: No edema.     Left lower leg: No edema.     Comments: Left arm: Tenderness palpation at [facility] joint.  No deformity noted.  Decreased range of motion with overhead flexion and extension.  Strength 5/5 bilateral upper and lower extremities.  Negative empty can and drop arm.  Hands neurovascularly intact.  Neurological:     Mental Status: He is alert.  Psychiatric:        Behavior: Behavior is cooperative.      UC Treatments / Results  Labs (all labs ordered are listed, but only abnormal results are displayed) Labs Reviewed - No data to display  EKG   Radiology DG Shoulder Left  Result Date: 04/11/2021 CLINICAL DATA:  Pain and limited range of motion EXAM: LEFT SHOULDER - 2+ VIEW COMPARISON:  None. FINDINGS: Frontal, oblique, Y scapular, and axillary images were obtained. No fracture or dislocation. There is mild osteoarthritic change in the acromioclavicular joint. The glenohumeral joint appears unremarkable. No erosive change or intra-articular calcification. Visualized left lung clear. 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 The glenohumeral joint appears unremarkable. No fracture or dislocation. Electronically Signed   By: Bretta Bang III M.D.   On: 04/11/2021 11:23    Procedures Procedures (including critical care time)  Medications Ordered in UC Medications - No data to display  Initial Impression / Assessment and Plan / UC Course  I have reviewed the triage vital signs and the nursing notes.  Pertinent labs & imaging results that were available during my care of  the patient were reviewed by me and considered in my medical decision making (see chart for details).     Physical exam reassuring today.  X-ray showed arthritis and AC joint where patient was point tender discussed that this is likely cause of symptoms.  He was prescribed Naprosyn with instruction not to additional NSAIDs due to risk of GI bleeding.  He can use heat and stretch for additional symptom relief.  Discussed that if  he has any recurrent swelling or erythema he needs to be seen immediately in order to get ultrasound. Discussed potentially utility of getting ultrasound today but patient declined this given improvement of symptoms.  Patient was provided work excuse note.  Strict return precautions given to which patient expressed understanding.  Discussed that blood pressure is elevated.  Patient does not have a PCP but we will try to establish him with PCP for ongoing management of arm pain and blood pressure.  Discussed that he should avoid caffeine, decongestants, salt.  Strict instruction to seek immediate medical attention with any headaches, dizziness, chest pain, leg swelling.  Strict return precautions given to which patient expressed understanding.  Final Clinical Impressions(s) / UC Diagnoses   Final diagnoses:  Chronic left shoulder pain  Left arm pain  Elevated blood pressure reading     Discharge Instructions     Do not take any additional NSAIDs (aspirin, ibuprofen/Advil, naproxen/Aleve) with this medication due to risk of GI bleeding.  Use heat and rest for symptom management.  You may need to see an orthopedic provider so please follow-up with PCP as we discussed.  Please keep track of your blood pressure and if it is elevated you need to be seen at PCP or if you cannot be seen by them within 1 week come back to see [place].  If you develop any worsening symptoms including redness or swelling you need to be seen immediately as we discussed.    ED Prescriptions     Medication Sig Dispense Auth. Provider   naproxen (NAPROSYN) 375 MG tablet Take 1 tablet (375 mg total) by mouth 2 (two) times daily. 20 tablet Case Vassell, Noberto Retort, PA-C     PDMP not reviewed this encounter.   Jeani Hawking, PA-C 04/11/21 1137

## 2021-04-11 NOTE — ED Triage Notes (Signed)
Pt c/o left arm pain and swelling X couple months. Pt states he was unable to make a fist yesterday. Pt denies chest pain and SOB. He states his arm started to hurt after receiving the [employer] COVID vaccine. He states he sometimes has headaches.

## 2021-04-11 NOTE — Discharge Instructions (Signed)
Do not take any additional NSAIDs (aspirin, ibuprofen/Advil, naproxen/Aleve) with this medication due to risk of GI bleeding.  Use heat and rest for symptom management.  You may need to see an orthopedic provider so please follow-up with PCP as we discussed.  Please keep track of your blood pressure and if it is elevated you need to be seen at PCP or if you cannot be seen by them within 1 week come back to see [place].  If you develop any worsening symptoms including redness or swelling you need to be seen immediately as we discussed.

## 2021-04-16 ENCOUNTER — Telehealth (HOSPITAL_BASED_OUTPATIENT_CLINIC_OR_DEPARTMENT_OTHER): Payer: Self-pay

## 2021-04-16 NOTE — Telephone Encounter (Signed)
-----   Message from Katlynne A Pelkey, RN sent at 04/13/2021 11:25 AM EDT ----- Regarding: UC to PCP Patient needs to establish with PCP - routine   

## 2021-12-22 IMAGING — DX DG SHOULDER 2+V*L*
4 series · 4 of 4 positions shown · non-contrast
Comparison: None.

CLINICAL DATA: Pain and limited range of motion

EXAM:
LEFT SHOULDER - 2+ VIEW

[shoulder ap]
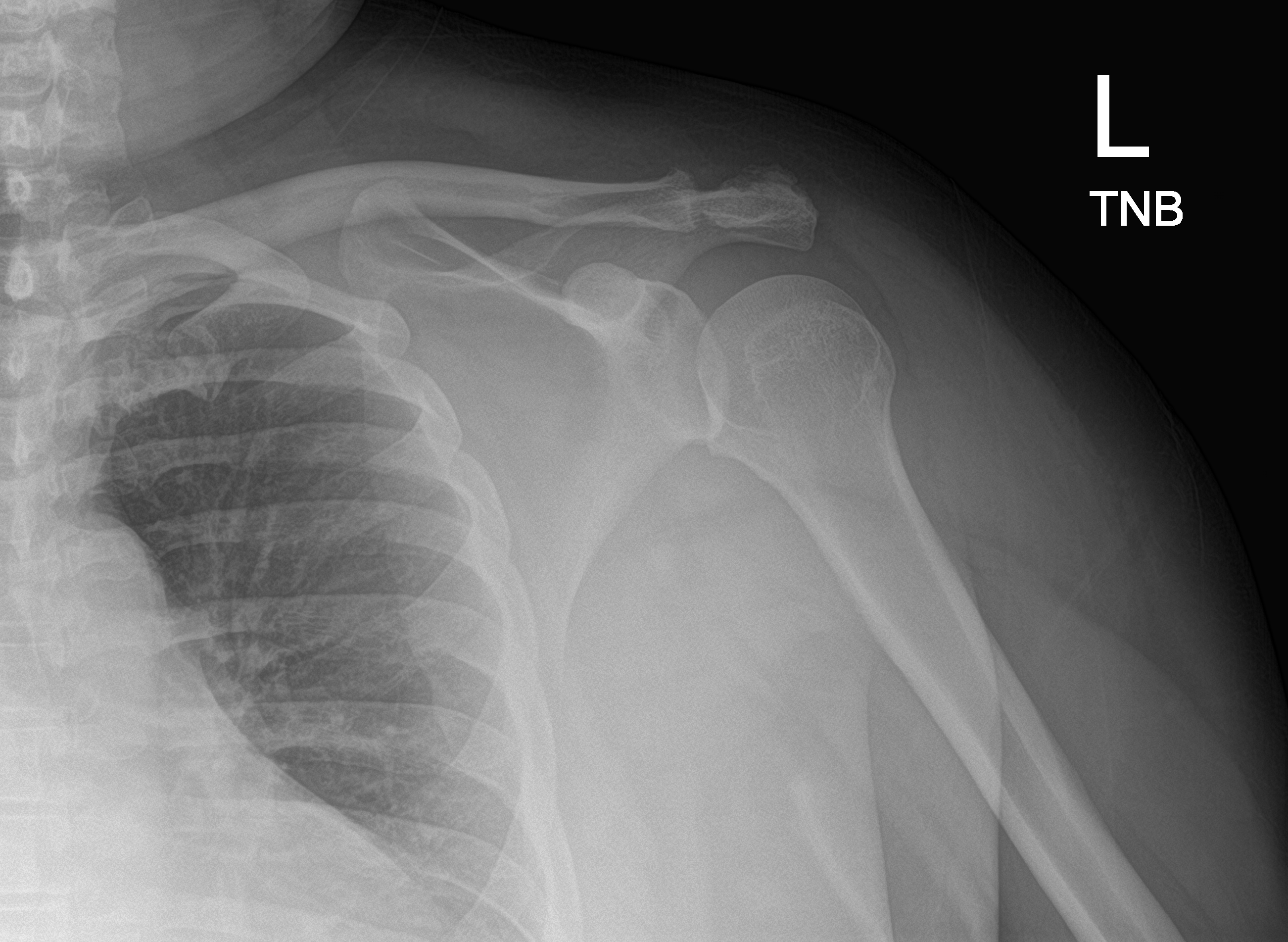

[shoulder grashey]
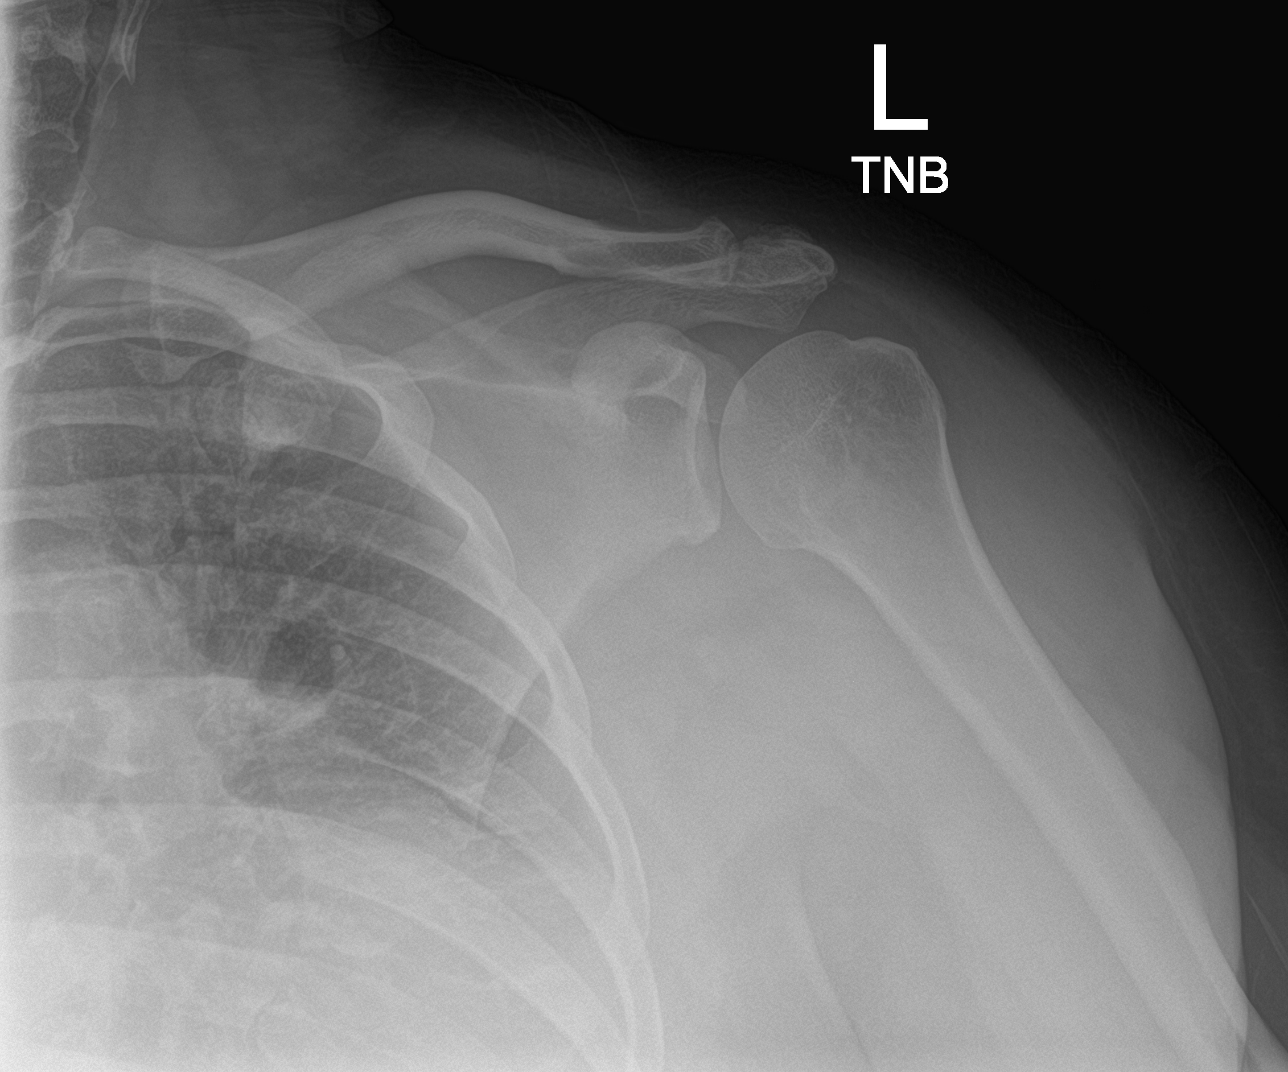

[shoulder y-view]
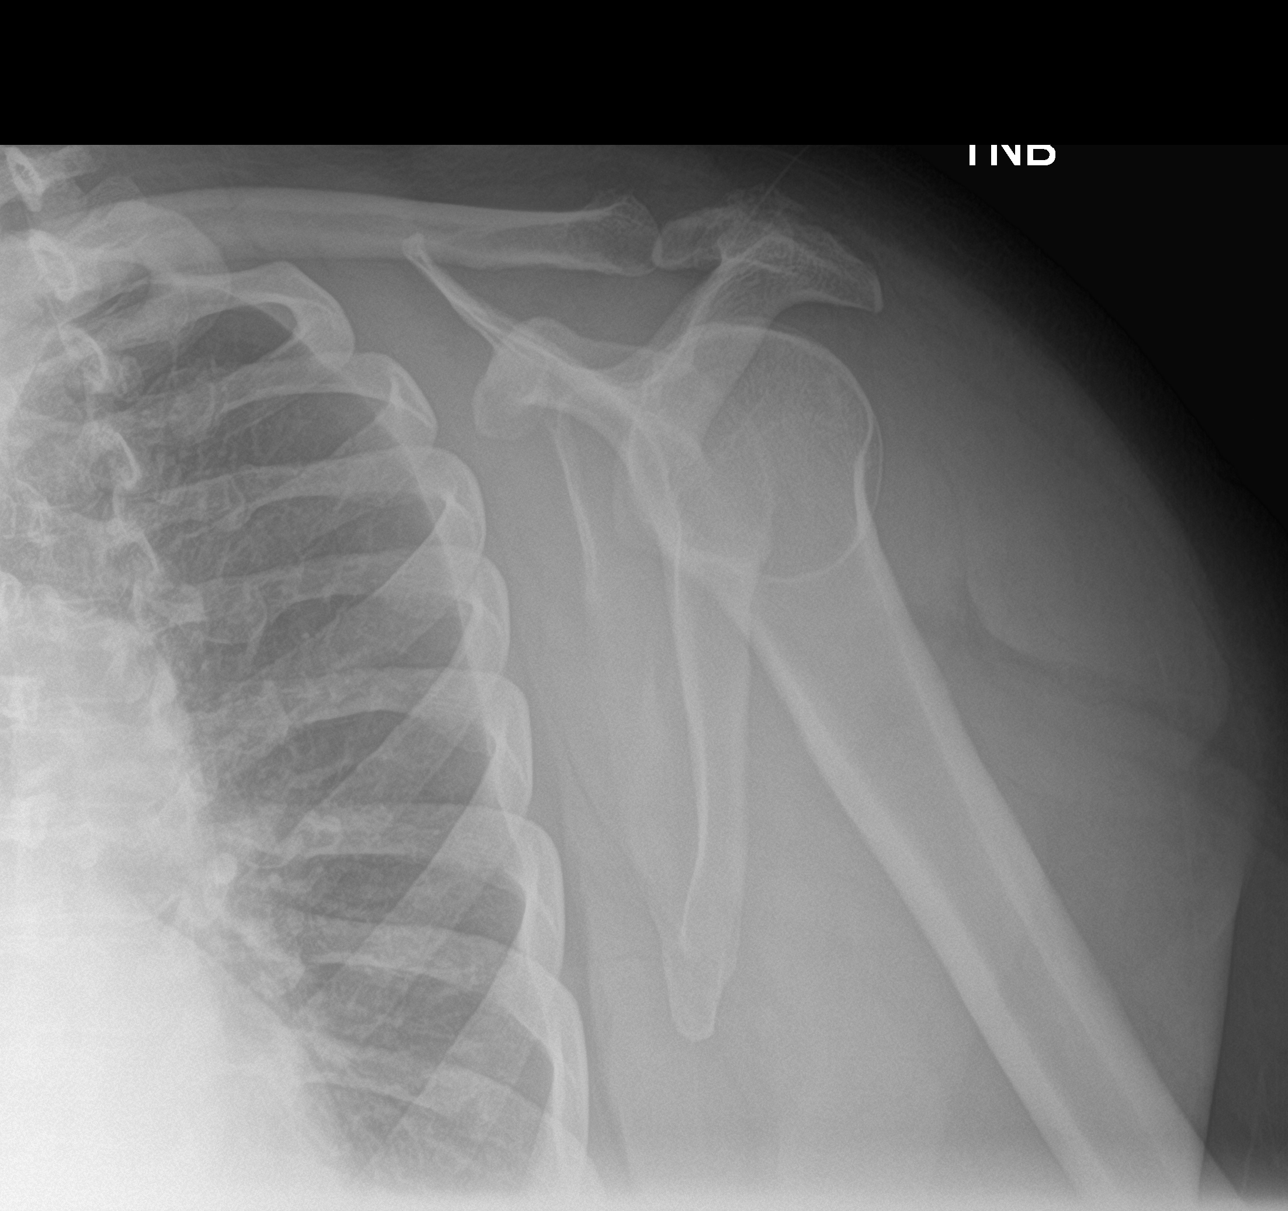

[shoulder axial]
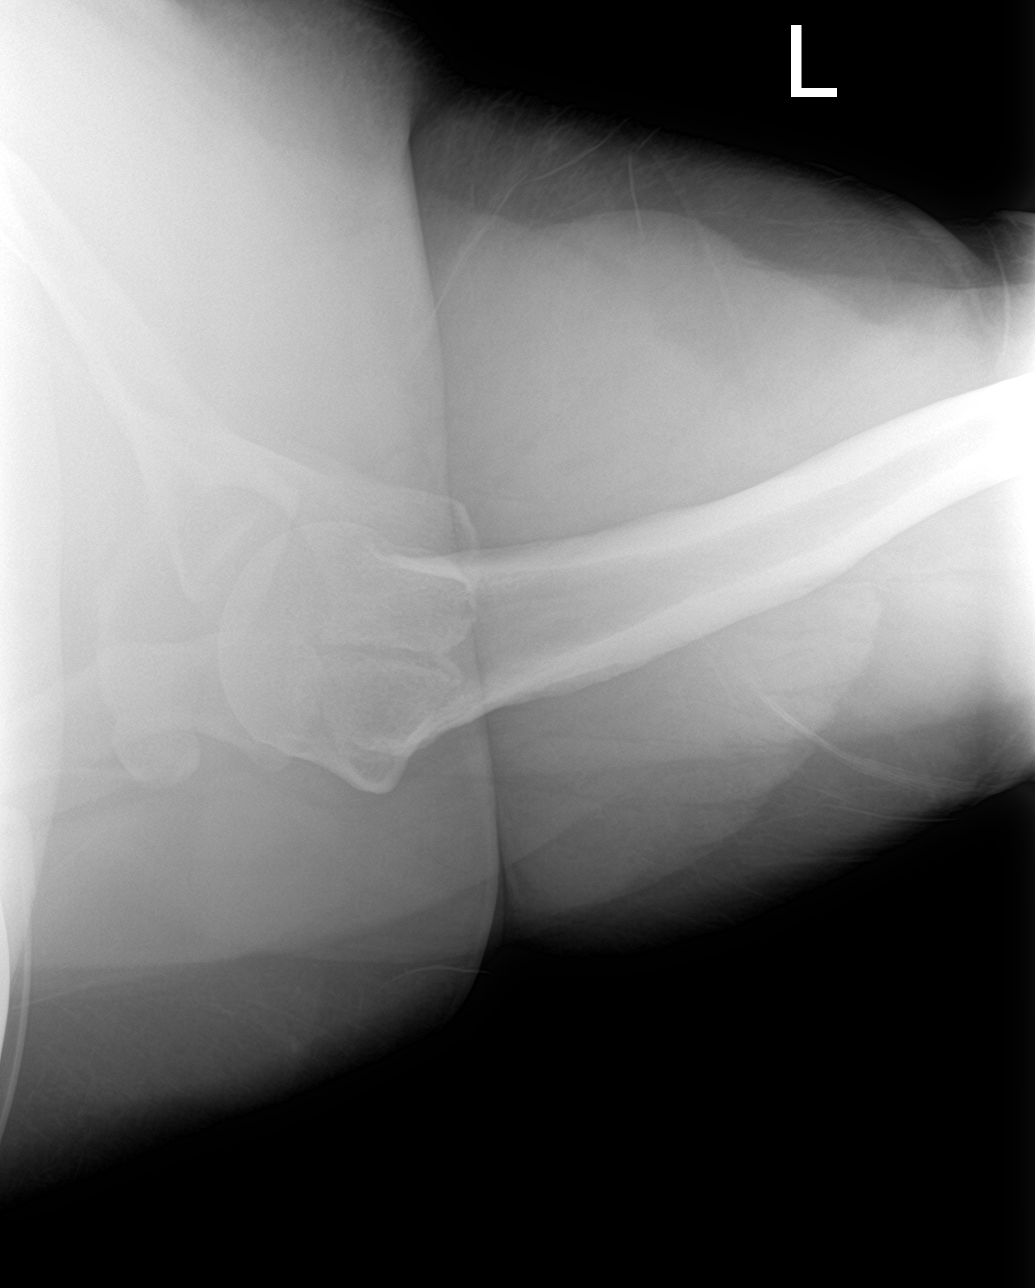

[4 of 4 positions shown; findings below may reference images not displayed]

FINDINGS: Frontal, oblique, Y scapular, and axillary images were obtained. No
fracture or dislocation. There is mild osteoarthritic change in the
acromioclavicular joint. The glenohumeral joint appears
unremarkable. No erosive change or intra-articular calcification.
Visualized left lung clear.
IMPRESSION: Mild osteoarthritic change in the acromioclavicular joint. The
glenohumeral joint appears unremarkable. No fracture or dislocation.

## 2023-08-14 ENCOUNTER — Encounter (HOSPITAL_COMMUNITY): Payer: Self-pay | Admitting: Emergency Medicine

## 2023-08-14 ENCOUNTER — Ambulatory Visit (HOSPITAL_COMMUNITY)
Admission: EM | Admit: 2023-08-14 | Discharge: 2023-08-14 | Disposition: A | Discharge status: Home / Self Care | Payer: 59 | Attending: Physician Assistant | Admitting: Physician Assistant

## 2023-08-14 DIAGNOSIS — L237 Allergic contact dermatitis due to plants, except food: Secondary | ICD-10-CM

## 2023-08-14 MED ORDER — PREDNISONE 10 MG PO TABS: ORAL_TABLET | ORAL | 0 refills | Status: AC

## 2023-08-14 NOTE — ED Triage Notes (Signed)
Pt has red areas on neck, right arm, posterior legs. Reports for 2-3 weeks. Tried Cortisone and another cream without relief.

## 2023-08-14 NOTE — Discharge Instructions (Addendum)
Take prednisone as prescribed Can continue with cortisone cream  Return if no improvement or symptoms become worse

## 2023-08-14 NOTE — ED Provider Notes (Addendum)
[facility]    CSN: [id number] Arrival date & time: 08/14/23  1440      History   Chief Complaint Chief Complaint  Patient presents with   Rash    HPI Dustin Mcgrath is a 52 y.o. male.   .  Patient presents with an itching rash to his right elbow flexor space, lower abdomen that started 2 to 3 weeks ago.  He reports he was working at out in the yard clearing debris and trees before rash started.  He has tried cortisone cream and other topical OTC creams with minimal relief.  He denies new soaps, detergents, lotions.  Denies fever, chills.    History reviewed. No pertinent past medical history.  There are no problems to display for this patient.   Past Surgical History:  Procedure Laterality Date   APPENDECTOMY         Home Medications    Prior to Admission medications   Medication Sig Start Date End Date Taking? Authorizing Provider  predniSONE (DELTASONE) 10 MG tablet Take 6 tablets (60 mg total) by mouth daily for 3 days, THEN 4 tablets (40 mg total) daily for 3 days, THEN 2 tablets (20 mg total) daily for 3 days, THEN 1 tablet (10 mg total) daily for 3 days. 08/14/23 08/26/23 Yes Ward, Tylene Fantasia, PA-C  naproxen (NAPROSYN) 375 MG tablet Take 1 tablet (375 mg total) by mouth 2 (two) times daily. 04/11/21   Raspet, Noberto Retort, PA-C  Ranitidine HCl (ZANTAC PO) Take 1 tablet by mouth 2 (two) times daily.    [provider]    Family History No family history on file.  Social History Social History   Tobacco Use   Smoking status: Never   Smokeless tobacco: Never  Substance Use Topics   Alcohol use: No   Drug use: No     Allergies   Patient has no known allergies.   Review of Systems Review of Systems  Constitutional:  Negative for chills and fever.  HENT:  Negative for ear pain and sore throat.   Eyes:  Negative for pain and visual disturbance.  Respiratory:  Negative for cough and shortness of breath.   Cardiovascular:  Negative for  chest pain and palpitations.  Gastrointestinal:  Negative for abdominal pain and vomiting.  Genitourinary:  Negative for dysuria and hematuria.  Musculoskeletal:  Negative for arthralgias and back pain.  Skin:  Positive for rash. Negative for color change.  Neurological:  Negative for seizures and syncope.  All other systems reviewed and are negative.    Physical Exam Triage Vital Signs ED Triage Vitals  Encounter Vitals Group     BP 08/14/23 1647 (!) 162/98     Systolic BP Percentile --      Diastolic BP Percentile --      Pulse Rate 08/14/23 1647 69     Resp 08/14/23 1647 19     Temp 08/14/23 1647 97.7 F (36.5 C)     Temp Source 08/14/23 1647 Oral     SpO2 08/14/23 1647 94 %     Weight --      Height --      Head Circumference --      Peak Flow --      Pain Score 08/14/23 1646 0     Pain Loc --      Pain Education --      Exclude from Growth Chart --    No data found.  Updated Vital Signs BP Marland Kitchen)  162/98 (BP Location: Left Arm)   Pulse 69   Temp 97.7 F (36.5 C) (Oral)   Resp 19   SpO2 94%   Visual Acuity Right Eye Distance:   Left Eye Distance:   Bilateral Distance:    Right Eye Near:   Left Eye Near:    Bilateral Near:     Physical Exam Vitals and nursing note reviewed.  Constitutional:      General: He is not in acute distress.    Appearance: He is well-developed.  HENT:     Head: Normocephalic and atraumatic.  Eyes:     Conjunctiva/sclera: Conjunctivae normal.  Cardiovascular:     Rate and Rhythm: Normal rate and regular rhythm.     Heart sounds: No murmur heard. Pulmonary:     Effort: Pulmonary effort is normal. No respiratory distress.     Breath sounds: Normal breath sounds.  Abdominal:     Palpations: Abdomen is soft.     Tenderness: There is no abdominal tenderness.  Musculoskeletal:        General: No swelling.     Cervical back: Neck supple.  Skin:    General: Skin is warm and dry.     Capillary Refill: Capillary refill takes less  than 2 seconds.     Comments: Rash noted to flexor space of right elbow as well as lower abdomen, erythematous plaque with small vesicles.  Neurological:     Mental Status: He is alert.  Psychiatric:        Mood and Affect: Mood normal.      UC Treatments / Results  Labs (all labs ordered are listed, but only abnormal results are displayed) Labs Reviewed - No data to display  EKG   Radiology No results found.  Procedures Procedures (including critical care time)  Medications Ordered in UC Medications - No data to display  Initial Impression / Assessment and Plan / UC Course  I have reviewed the triage vital signs and the nursing notes.  Pertinent labs & imaging results that were available during my care of the patient were reviewed by me and considered in my medical decision making (see chart for details).     Poison ivy rash.  Prednisone prescribed.  Supportive care discussed.  Return precautions discussed.  Final Clinical Impressions(s) / UC Diagnoses   Final diagnoses:  Contact dermatitis due to poison ivy     Discharge Instructions      Take prednisone as prescribed Can continue with cortisone cream  Return if no improvement or symptoms become worse     ED Prescriptions     Medication Sig Dispense Auth. Provider   predniSONE (DELTASONE) 10 MG tablet Take 6 tablets (60 mg total) by mouth daily for 3 days, THEN 4 tablets (40 mg total) daily for 3 days, THEN 2 tablets (20 mg total) daily for 3 days, THEN 1 tablet (10 mg total) daily for 3 days. 39 tablet Ward, Tylene Fantasia, PA-C      PDMP not reviewed this encounter.   Ward, Tylene Fantasia, PA-C 08/14/23 1733    Ward, Tylene Fantasia, PA-C 08/14/23 1737

## 2023-08-27 ENCOUNTER — Encounter (HOSPITAL_COMMUNITY): Payer: Self-pay

## 2023-08-27 ENCOUNTER — Ambulatory Visit (HOSPITAL_COMMUNITY)
Admission: EM | Admit: 2023-08-27 | Discharge: 2023-08-27 | Disposition: A | Discharge status: Home / Self Care | Payer: Self-pay | Attending: Family Medicine | Admitting: Family Medicine

## 2023-08-27 DIAGNOSIS — L255 Unspecified contact dermatitis due to plants, except food: Secondary | ICD-10-CM

## 2023-08-27 MED ORDER — DEXAMETHASONE SODIUM PHOSPHATE 10 MG/ML IJ SOLN
10.0000 mg | Freq: Once | INTRAMUSCULAR | Status: AC
  Administered 2023-08-27: 10 mg via INTRAMUSCULAR

## 2023-08-27 MED ORDER — DEXAMETHASONE SODIUM PHOSPHATE 10 MG/ML IJ SOLN
INTRAMUSCULAR | Status: AC
  Filled 2023-08-27: qty 1

## 2023-08-27 MED ORDER — TRIAMCINOLONE ACETONIDE 0.1 % EX CREA: TOPICAL_CREAM | Freq: Two times a day (BID) | CUTANEOUS | 2 refills | Status: DC

## 2023-08-27 MED ORDER — PREDNISONE 10 MG (48) PO TBPK: ORAL_TABLET | ORAL | 0 refills | Status: DC

## 2023-08-27 MED ORDER — AMOXICILLIN-POT CLAVULANATE 875-125 MG PO TABS: 1.0000 | ORAL_TABLET | Freq: Two times a day (BID) | ORAL | 0 refills | Status: DC

## 2023-08-27 NOTE — ED Triage Notes (Signed)
Patient with rash all over body--trunk, arms, legs, and hands. Patient states he was dx with poison ivy last time he was here but it is getting worse.

## 2023-08-27 NOTE — Discharge Instructions (Addendum)
Meds ordered this encounter  Medications   predniSONE (STERAPRED UNI-PAK 48 TAB) 10 MG (48) TBPK tablet    Sig: Take as directed.    Dispense:  48 tablet    Refill:  0   amoxicillin-clavulanate (AUGMENTIN) 875-125 MG tablet    Sig: Take 1 tablet by mouth every 12 (twelve) hours.    Dispense:  14 tablet    Refill:  0   triamcinolone 0.1%-Cetaphil equivalent 3:1 cream mixture    Sig: Apply topically 2 (two) times daily.    Dispense:  454 g    Refill:  2   dexamethasone (DECADRON) injection 10 mg

## 2023-08-28 NOTE — ED Provider Notes (Signed)
  [facility]   [id number] 08/27/23 Arrival Time: 1151  ASSESSMENT & PLAN:  1. Rhus dermatitis    With superficial bacterial infection at places.  Meds ordered this encounter  Medications   predniSONE (STERAPRED UNI-PAK 48 TAB) 10 MG (48) TBPK tablet    Sig: Take as directed.    Dispense:  48 tablet    Refill:  0   amoxicillin-clavulanate (AUGMENTIN) 875-125 MG tablet    Sig: Take 1 tablet by mouth every 12 (twelve) hours.    Dispense:  14 tablet    Refill:  0   triamcinolone 0.1%-Cetaphil equivalent 3:1 cream mixture    Sig: Apply topically 2 (two) times daily.    Dispense:  454 g    Refill:  2   dexamethasone (DECADRON) injection 10 mg   Will follow up with PCP or here if worsening or failing to improve as anticipated. Reviewed expectations re: course of current medical issues. Questions answered. Outlined signs and symptoms indicating need for more acute intervention. Patient verbalized understanding. After Visit Summary given.   SUBJECTIVE:  Dustin Mcgrath is a 52 y.o. male who presents with a skin complaint.  Patient with rash all over body--trunk, arms, legs, and hands. Patient states he was dx with poison ivy last time he was here but it is getting worse.  Denies fever.  OBJECTIVE: Vitals:   08/27/23 1321  BP: (!) 148/93  Pulse: 87  Resp: 18  Temp: 98.2 F (36.8 C)  TempSrc: Oral  SpO2: 95%    General appearance: alert; no distress HEENT: [place]; AT Neck: supple with FROM Lungs: clear to auscultation bilaterally Heart: regular rate and rhythm Extremities: no edema; moves all extremities normally Skin: warm and dry; areas of linear papules and vesicles with surrounding erythema over torso and extremities with honey-colored crusting over various places Psychological: alert and cooperative; normal mood and affect  No Known Allergies  History reviewed. No pertinent past medical history. Social History   Socioeconomic History   Marital status:  Married    Spouse name: Not on file   Number of children: Not on file   Years of education: Not on file   Highest education level: Not on file  Occupational History   Not on file  Tobacco Use   Smoking status: Never   Smokeless tobacco: Never  Substance and Sexual Activity   Alcohol use: No   Drug use: No   Sexual activity: Yes  Other Topics Concern   Not on file  Social History Narrative   Not on file   Social Determinants of Health   Financial Resource Strain: Not on file  Food Insecurity: Not on file  Transportation Needs: Not on file  Physical Activity: Not on file  Stress: Not on file  Social Connections: Not on file  Intimate Partner Violence: Not on file   History reviewed. No pertinent family history. Past Surgical History:  Procedure Laterality Date   APPENDECTOMY        Mardella Layman, MD 08/28/23 1028

## 2024-08-30 ENCOUNTER — Encounter (HOSPITAL_COMMUNITY): Payer: Self-pay

## 2024-08-30 ENCOUNTER — Ambulatory Visit (HOSPITAL_COMMUNITY)
Admission: EM | Admit: 2024-08-30 | Discharge: 2024-08-30 | Disposition: A | Discharge status: Home / Self Care | Payer: Self-pay | Attending: Family Medicine | Admitting: Family Medicine

## 2024-08-30 ENCOUNTER — Ambulatory Visit (INDEPENDENT_AMBULATORY_CARE_PROVIDER_SITE_OTHER): Payer: Self-pay

## 2024-08-30 DIAGNOSIS — M25562 Pain in left knee: Secondary | ICD-10-CM | POA: Insufficient documentation

## 2024-08-30 DIAGNOSIS — R42 Dizziness and giddiness: Secondary | ICD-10-CM | POA: Insufficient documentation

## 2024-08-30 LAB — POCT FASTING CBG KUC MANUAL ENTRY: POCT Glucose (KUC): 303 mg/dL — AB (ref 70–99)

## 2024-08-30 LAB — CBC
HCT: 46.4 % (ref 39.0–52.0)
Hemoglobin: 15.4 g/dL (ref 13.0–17.0)
MCH: 27.8 pg (ref 26.0–34.0)
MCHC: 33.2 g/dL (ref 30.0–36.0)
MCV: 83.8 fL (ref 80.0–100.0)
Platelets: 213 K/uL (ref 150–400)
RBC: 5.54 MIL/uL (ref 4.22–5.81)
RDW: 13.2 % (ref 11.5–15.5)
WBC: 5.1 K/uL (ref 4.0–10.5)
nRBC: 0 % (ref 0.0–0.2)

## 2024-08-30 LAB — COMPREHENSIVE METABOLIC PANEL WITH GFR
ALT: 25 U/L (ref 0–44)
AST: 20 U/L (ref 15–41)
Albumin: 3.7 g/dL (ref 3.5–5.0)
Alkaline Phosphatase: 81 U/L (ref 38–126)
Anion gap: 10 (ref 5–15)
BUN: 8 mg/dL (ref 6–20)
CO2: 25 mmol/L (ref 22–32)
Calcium: 9.6 mg/dL (ref 8.9–10.3)
Chloride: 100 mmol/L (ref 98–111)
Creatinine, Ser: 0.72 mg/dL (ref 0.61–1.24)
GFR, Estimated: >60 mL/min (ref >60)
Glucose, Bld: 276 mg/dL — ABNORMAL HIGH (ref 70–99)
Potassium: 3.9 mmol/L (ref 3.5–5.1)
Sodium: 135 mmol/L (ref 135–145)
Total Bilirubin: 0.7 mg/dL (ref 0.0–1.2)
Total Protein: 7.2 g/dL (ref 6.5–8.1)

## 2024-08-30 MED ORDER — METFORMIN HCL 500 MG PO TABS: 500.0000 mg | ORAL_TABLET | Freq: Every day | ORAL | 2 refills | Status: DC

## 2024-08-30 NOTE — ED Triage Notes (Signed)
 Patient presents to the office for left knee pain x 3 months. Patient states he knee is not stable. Patient denies any recent falls or trauma to his knee.

## 2024-08-30 NOTE — Discharge Instructions (Addendum)
 The knee x-ray shows some arthritis. The radiologist will also read your x-ray, and if their interpretation differs significantly from mine, and the management of your condition would change, we will call you.  Use Tylenol  500 mg over-the-counter--2 every 6 hours as needed for pain  Your sugar was 303, meaning you most likely have diabetes. High sugars may or may not be the cause of the brief dizziness spells you have had.  We have drawn blood to check liver and kidney function and electrolytes and blood counts.  Staff will notify you if there is anything significantly abnormal  Start taking metformin  500 mg 1 daily for diabetes.  Is absolutely necessary that you follow-up with primary care.  (La radiografa de rodilla muestra algo de artritis. El radilogo tambin interpretar su radiografa y, si su interpretacin difiere significativamente de la ma y el tratamiento de su afeccin cambia, Photographer.  Tome Tylenol  500 mg sin receta: 2 dosis cada 6 horas segn sea necesario para el dolor.  Su nivel de azcar fue de 303, lo que significa que probablemente tenga diabetes. Los [employer] de azcar pueden o no ser la causa de los breves mareos que ha tenido.  Le hemos extrado sangre para comprobar la funcin heptica y renal, as como los electrolitos y [place]. El [employer]er notificar si detecta alguna anomala significativa.  Comience a tomar metformina 500 mg una vez al da para la diabetes.  Es absolutamente necesario que consulte con su mdico de cabecera para su seguimiento.)

## 2024-08-30 NOTE — ED Provider Notes (Signed)
 [facility]    CSN: [id number] Arrival date & time: 08/30/24  1236      History   Chief Complaint Chief Complaint  Patient presents with   Knee Pain    HPI Dajion Bickford is a 53 y.o. male.    Knee Pain Here for left knee pain and sometimes some popping.  Has been bothering him a few months.  It is more tender on touching it than it is spontaneously.  He did feel like he slipped recently and felt a pop then.  He also notes intermittent dizziness that lasts just a moment or 2 and is very intermittent.  NKDA   History reviewed. No pertinent past medical history.  There are no active problems to display for this patient.   Past Surgical History:  Procedure Laterality Date   APPENDECTOMY         Home Medications    Prior to Admission medications   Medication Sig Start Date End Date Taking? Authorizing Provider  metFORMIN  (GLUCOPHAGE ) 500 MG tablet Take 1 tablet (500 mg total) by mouth daily with breakfast. 08/30/24  Yes [place] Canty, Sharlet POUR, MD    Family History History reviewed. No pertinent family history.  Social History Social History   Tobacco Use   Smoking status: Never   Smokeless tobacco: Never  Substance Use Topics   Alcohol use: No   Drug use: No     Allergies   Patient has no known allergies.   Review of Systems Review of Systems   Physical Exam Triage Vital Signs ED Triage Vitals  Encounter Vitals Group     BP 08/30/24 1338 (!) 135/90     Girls Systolic BP Percentile --      Girls Diastolic BP Percentile --      Boys Systolic BP Percentile --      Boys Diastolic BP Percentile --      Pulse Rate 08/30/24 1338 83     Resp 08/30/24 1338 18     Temp 08/30/24 1338 98 F (36.7 C)     Temp Source 08/30/24 1338 Oral     SpO2 08/30/24 1338 95 %     Weight --      Height --      Head Circumference --      Peak Flow --      Pain Score 08/30/24 1343 8     Pain Loc --      Pain Education --      Exclude from Growth Chart --     No data found.  Updated Vital Signs BP (!) 135/90 (BP Location: Left Arm)   Pulse 83   Temp 98 F (36.7 C) (Oral)   Resp 18   SpO2 95%   Visual Acuity Right Eye Distance:   Left Eye Distance:   Bilateral Distance:    Right Eye Near:   Left Eye Near:    Bilateral Near:     Physical Exam Vitals reviewed.  Constitutional:      General: He is not in acute distress.    Appearance: He is not ill-appearing, toxic-appearing or diaphoretic.  HENT:     Mouth/Throat:     Mouth: Mucous membranes are moist.  Eyes:     Extraocular Movements: Extraocular movements intact.     Conjunctiva/sclera: Conjunctivae normal.     Pupils: Pupils are equal, round, and reactive to light.  Cardiovascular:     Rate and Rhythm: Normal rate and regular rhythm.  Heart sounds: No murmur heard. Pulmonary:     Effort: Pulmonary effort is normal.     Breath sounds: Normal breath sounds.  Musculoskeletal:     Cervical back: Neck supple.     Comments: Left knee is nontender.  There is possibly a tiny effusion.  Normal range of motion.  No deformity  Lymphadenopathy:     Cervical: No cervical adenopathy.  Skin:    Coloration: Skin is not jaundiced or pale.  Neurological:     General: No focal deficit present.     Mental Status: He is alert and oriented to person, place, and time.      UC Treatments / Results  Labs (all labs ordered are listed, but only abnormal results are displayed) Labs Reviewed  POCT FASTING CBG [facility] MANUAL ENTRY - Abnormal; Notable for the following components:      Result Value   POCT Glucose ([facility]) 303 (*)    All other components within normal limits  CBC  COMPREHENSIVE METABOLIC PANEL WITH GFR    EKG   Radiology No results found.  Procedures Procedures (including critical care time)  Medications Ordered in UC Medications - No data to display  Initial Impression / Assessment and Plan / UC Course  I have reviewed the triage vital signs and the nursing  notes.  Pertinent labs & imaging results that were available during my care of the patient were reviewed by me and considered in my medical decision making (see chart for details).     The knee x-ray shows some mild degenerative changes  He is advised of radiology overread  Knee sleeve brace is applied here  Sugar was done since he had the intermittent dizziness and it was 303.  CBC and CMP are drawn and we will notify him of any significant abnormalities.  Metformin  sent in to start treatment. Staff made him a PCP appointment Final Clinical Impressions(s) / UC Diagnoses   Final diagnoses:  Acute pain of left knee  Dizziness     Discharge Instructions      The knee x-ray shows some arthritis. The radiologist will also read your x-ray, and if their interpretation differs significantly from mine, and the management of your condition would change, we will call you.  Use Tylenol  500 mg over-the-counter--2 every 6 hours as needed for pain  Your sugar was 303, meaning you most likely have diabetes. High sugars may or may not be the cause of the brief dizziness spells you have had.  We have drawn blood to check liver and kidney function and electrolytes and blood counts.  Staff will notify you if there is anything significantly abnormal  Start taking metformin  500 mg 1 daily for diabetes.  Is absolutely necessary that you follow-up with primary care.      ED Prescriptions     Medication Sig Dispense Auth. Provider   metFORMIN  (GLUCOPHAGE ) 500 MG tablet Take 1 tablet (500 mg total) by mouth daily with breakfast. 30 tablet Oberon Hehir K, MD      PDMP not reviewed this encounter.   Vonna Sharlet POUR, MD 08/30/24 [phone]

## 2024-08-31 ENCOUNTER — Ambulatory Visit (HOSPITAL_COMMUNITY): Payer: Self-pay

## 2024-10-06 ENCOUNTER — Ambulatory Visit (INDEPENDENT_AMBULATORY_CARE_PROVIDER_SITE_OTHER): Payer: Self-pay | Admitting: Internal Medicine

## 2024-10-06 ENCOUNTER — Encounter: Payer: Self-pay | Admitting: Internal Medicine

## 2024-10-06 VITALS — BP 144/90 | HR 86 | Temp 98.6°F | Ht 65.0 in | Wt 297.6 lb

## 2024-10-06 DIAGNOSIS — Z7984 Long term (current) use of oral hypoglycemic drugs: Secondary | ICD-10-CM

## 2024-10-06 DIAGNOSIS — E1165 Type 2 diabetes mellitus with hyperglycemia: Secondary | ICD-10-CM

## 2024-10-06 DIAGNOSIS — R030 Elevated blood-pressure reading, without diagnosis of hypertension: Secondary | ICD-10-CM

## 2024-10-06 LAB — HEMOGLOBIN A1C: Hgb A1c MFr Bld: 12.3 % — ABNORMAL HIGH (ref 4.6–6.5)

## 2024-10-06 LAB — BASIC METABOLIC PANEL WITH GFR
BUN: 8 mg/dL (ref 6–23)
CO2: 28 meq/L (ref 19–32)
Calcium: 9.7 mg/dL (ref 8.4–10.5)
Chloride: 100 meq/L (ref 96–112)
Creatinine, Ser: 0.77 mg/dL (ref 0.40–1.50)
GFR: 103.36 mL/min (ref >60.00)
Glucose, Bld: 273 mg/dL — ABNORMAL HIGH (ref 70–99)
Potassium: 4 meq/L (ref 3.5–5.1)
Sodium: 135 meq/L (ref 135–145)

## 2024-10-06 LAB — TSH: TSH: 1.12 u[IU]/mL (ref 0.35–5.50)

## 2024-10-06 NOTE — Patient Instructions (Addendum)
 BLOOD PRESSURE: Obtain an automatic blood pressure machine if you do not have one.   Check your blood pressure at home, write down blood pressure readings and bring to next appointment.  Goal is BP less than 130/80 consistently.  Adhere to a low salt diet ( no more than 1500mg  of salt/sodium per day) and exercise regularly   The nutrition facts label is a good place to find how much sodium is in foods. Look for products with no more than 400 mg of sodium per serving.  Remember that 1.5 g = 1500 mg.  The food label may also list foods as:  Sodium-free: Less than 5 mg in a serving.  Very low sodium: 35 mg or less in a serving.  Low-sodium: 140 mg or less in a serving.  Light in sodium: 50% less sodium in a serving. For example, if a food that usually has 300 mg of sodium is changed to become light in sodium, it will have 150 mg of sodium.  Reduced sodium: 25% less sodium in a serving. For example, if a food that usually has 400 mg of sodium is changed to reduced sodium, it will have 300 mg of sodium.

## 2024-10-06 NOTE — Progress Notes (Unsigned)
 [facility] [address] [place] [address] Dept: [phone] Dept Fax: [phone]  New Patient Office Visit  Subjective:   Dustin Mcgrath 1971-01-03 10/06/2024  Chief Complaint  Patient presents with   New Patient (Initial Visit)    Patient states blood pressure is low and sugar is high. States he has had high blood pressure that fluctuates for a long time.    Declines interpreter.    HPI: Dustin Mcgrath presents today to establish care at [employer] [facility]. Introduced to Publishing rights manager role and practice setting.  All questions answered.  Concerns: See below   History of Present Illness   Dustin Mcgrath is a 53 year old male who presents for establishment of care and management of diabetes and elevated BP.   He was recently diagnosed with diabetes at an urgent care clinic and started on metformin . Initially, his blood sugar levels were around 300 mg/dL per patient. He takes metformin  500 mg, sometimes at nighttime due to his work schedule. His dizziness has improved since starting the medication. He does not check his blood sugar at home. No polyuria, polydipsia, polyphagia, or neuropathy.  He has a history of fluctuating blood pressure, with readings typically ranging from 138/80 mmHg to 144/90 mmHg. He has not been on any blood pressure medication before and does not monitor his blood pressure at home. 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 No headaches or blurry vision.  He has been working on weight loss, having reduced his weight from 325 lbs to 306 lbs.       The following portions of the patient's history were reviewed and updated as appropriate: past medical history, past surgical history, family history, social history, allergies, medications, and problem list.   Patient Active Problem List   Diagnosis Date  Noted   Type 2 diabetes mellitus with hyperglycemia, without long-term current use of insulin (HCC) 10/06/2024   History reviewed. No pertinent past medical history. Past Surgical History:  Procedure Laterality Date   APPENDECTOMY     History reviewed. No pertinent family history.  Current Outpatient Medications:    metFORMIN  (GLUCOPHAGE ) 500 MG tablet, Take 1 tablet (500 mg total) by mouth daily with breakfast., Disp: 30 tablet, Rfl: 2 No Known Allergies  ROS: A complete ROS was performed with pertinent positives/negatives noted in the HPI. The remainder of the ROS are negative.   Objective:   Today's Vitals   10/06/24 1359  BP: (!) 144/90  Pulse: 86  Temp: 98.6 F (37 C)  TempSrc: Oral  SpO2: 97%  Weight: 297 lb 9.6 oz (135 kg)  Height: 5' 5 (1.651 m)    GENERAL: Well-appearing, in NAD. obese SKIN: Pink, warm and dry. No rash, lesion, ulceration, or ecchymoses.  NECK: Trachea midline. Full ROM w/o pain or tenderness. No lymphadenopathy.  RESPIRATORY: Chest wall symmetrical. Respirations even and non-labored. Breath sounds clear to auscultation bilaterally.  CARDIAC: S1, S2 present, regular rate and rhythm. Peripheral pulses 2+ bilaterally.  EXTREMITIES: Without clubbing, cyanosis, or edema.  NEUROLOGIC: Steady, even gait.  PSYCH/MENTAL STATUS: Alert, oriented x 3. Cooperative, appropriate mood and affect.   Health Maintenance Due  Topic Date Due   FOOT EXAM  Never done   OPHTHALMOLOGY EXAM  Never done   HIV Screening  Never done   Diabetic kidney evaluation - Urine ACR  Never done   Hepatitis C Screening  Never done  DTaP/Tdap/Td (1 - Tdap) Never done   Pneumococcal Vaccine: 50+ Years (1 of 2 - PCV) Never done   Hepatitis B Vaccines 19-59 Average Risk (1 of 3 - 19+ 3-dose series) Never done   Colonoscopy  Never done   Zoster Vaccines- Shingrix (1 of 2) Never done   Influenza Vaccine  Never done   COVID-19 Vaccine (1 - 2025-26 season) Never done    Assessment  & Plan:  1. Elevated BP without diagnosis of hypertension (Primary) - Basic Metabolic Panel (BMET) - TSH - Advised patient to obtain BP machine , check BP at home when at rest. Write down readings and bring to next appt. Goal < 130/80 consistently  2. Type 2 diabetes mellitus with hyperglycemia, without long-term current use of insulin (HCC) - HgB A1c - Will adjust medication based on A1C results. Continue Metformin  500mg  PO daily at this time.   Orders Placed This Encounter  Procedures   HgB A1c   Basic Metabolic Panel (BMET)   TSH    Return in about 2 weeks (around 10/20/2024) for Blood Pressure re-check.   Rosina Senters, FNP

## 2024-10-07 ENCOUNTER — Encounter: Payer: Self-pay | Admitting: Internal Medicine

## 2024-10-13 ENCOUNTER — Ambulatory Visit: Payer: Self-pay | Admitting: Internal Medicine

## 2024-10-13 ENCOUNTER — Encounter: Payer: Self-pay | Admitting: Internal Medicine

## 2024-10-13 DIAGNOSIS — E1165 Type 2 diabetes mellitus with hyperglycemia: Secondary | ICD-10-CM

## 2024-10-13 MED ORDER — GLIPIZIDE ER 5 MG PO TB24: 5.0000 mg | ORAL_TABLET | Freq: Every day | ORAL | 1 refills | Status: AC

## 2024-10-13 MED ORDER — METFORMIN HCL 1000 MG PO TABS: 1000.0000 mg | ORAL_TABLET | Freq: Two times a day (BID) | ORAL | 1 refills | Status: AC

## 2024-10-19 NOTE — Telephone Encounter (Signed)
 Called pt unable to leave message have reached out on MyChart pt is not reading messages. Pt has up coming appointment on 10/20/24 at 10:00 am.

## 2024-10-20 ENCOUNTER — Ambulatory Visit (INDEPENDENT_AMBULATORY_CARE_PROVIDER_SITE_OTHER): Payer: Self-pay | Admitting: Internal Medicine

## 2024-10-20 ENCOUNTER — Encounter: Payer: Self-pay | Admitting: Internal Medicine

## 2024-10-20 VITALS — BP 131/80 | HR 81 | Temp 98.2°F | Ht 65.0 in | Wt 298.6 lb

## 2024-10-20 DIAGNOSIS — Z7984 Long term (current) use of oral hypoglycemic drugs: Secondary | ICD-10-CM

## 2024-10-20 DIAGNOSIS — I10 Essential (primary) hypertension: Secondary | ICD-10-CM

## 2024-10-20 DIAGNOSIS — E1165 Type 2 diabetes mellitus with hyperglycemia: Secondary | ICD-10-CM

## 2024-10-20 MED ORDER — AMLODIPINE BESYLATE 5 MG PO TABS: 5.0000 mg | ORAL_TABLET | Freq: Every day | ORAL | 1 refills | Status: AC

## 2024-10-20 NOTE — Progress Notes (Unsigned)
 [facility] [address] [place] [address] Dept: [phone] Dept Fax: [phone]    Subjective:   Dustin Mcgrath 28-Jul-1971 10/20/2024  Chief Complaint  Patient presents with   Follow-up    Bp recheck no  concerns    HPI: Dustin Mcgrath presents today for re-assessment and management of chronic medical conditions.  147/97, 148/88, 120/75, 126/79, 136/88 BP Readings from Last 3 Encounters:  10/20/24 (!) 132/92  10/06/24 (!) 144/90  08/30/24 (!) 135/90   Lab Results  Component Value Date   HGBA1C 12.3 (H) 10/06/2024     The following portions of the patient's history were reviewed and updated as appropriate: past medical history, past surgical history, family history, social history, allergies, medications, and problem list.   Patient Active Problem List   Diagnosis Date Noted   Primary hypertension 10/20/2024   Type 2 diabetes mellitus with hyperglycemia, without long-term current use of insulin (HCC) 10/06/2024   History reviewed. No pertinent past medical history. Past Surgical History:  Procedure Laterality Date   APPENDECTOMY     History reviewed. No pertinent family history.  Current Outpatient Medications:    amLODipine (NORVASC) 5 MG tablet, Take 1 tablet (5 mg total) by mouth daily. For high blood pressure., Disp: 90 tablet, Rfl: 1   glipiZIDE (GLUCOTROL XL) 5 MG 24 hr tablet, Take 1 tablet (5 mg total) by mouth daily with breakfast. (Patient not taking: Reported on 10/20/2024), Disp: 90 tablet, Rfl: 1   metFORMIN  (GLUCOPHAGE ) 1000 MG tablet, Take 1 tablet (1,000 mg total) by mouth 2 (two) times daily with a meal. (Patient not taking: Reported on 10/20/2024), Disp: 180 tablet, Rfl: 1 No Known Allergies   ROS: A complete ROS was performed with pertinent positives/negatives noted in the HPI. The remainder of the ROS are negative.    Objective:   Today's Vitals   10/20/24 1015  BP: (!)  132/92  Pulse: 81  Temp: 98.2 F (36.8 C)  TempSrc: Temporal  SpO2: 95%  Weight: 298 lb 9.6 oz (135.4 kg)  Height: 5' 5 (1.651 m)    GENERAL: Well-appearing, in NAD. Well nourished.  SKIN: Pink, warm and dry.  RESPIRATORY: Chest wall symmetrical. Respirations even and non-labored. Breath sounds clear to auscultation bilaterally.  CARDIAC: S1, S2 present, regular rate and rhythm. Peripheral pulses 2+ bilaterally.  EXTREMITIES: Without clubbing, cyanosis, or edema.  NEUROLOGIC: No motor or sensory deficits. Steady, even gait. Sensory exam of the foot is normal, tested with the monofilament. Good pulses, no lesions or ulcers, good peripheral pulses. PSYCH/MENTAL STATUS: Alert, oriented x 3. Cooperative, appropriate mood and affect.   Health Maintenance Due  Topic Date Due   OPHTHALMOLOGY EXAM  Never done   HIV Screening  Never done   Diabetic kidney evaluation - Urine ACR  Never done   Hepatitis C Screening  Never done   DTaP/Tdap/Td (1 - Tdap) Never done   Pneumococcal Vaccine: 50+ Years (1 of 2 - PCV) Never done   Hepatitis B Vaccines 19-59 Average Risk (1 of 3 - 19+ 3-dose series) Never done   Colonoscopy  Never done   Zoster Vaccines- Shingrix (1 of 2) Never done   Influenza Vaccine  Never done   COVID-19 Vaccine (1 - 2025-26 season) Never done    No results found for any visits on 10/20/24.  The ASCVD Risk score (Arnett DK, et al., 2019) failed to calculate for the following reasons:   Cannot find a previous HDL lab  Cannot find a previous total cholesterol lab     Assessment & Plan:   Orders Placed This Encounter  Procedures   AMB Referral VBCI Care Management    Referral Priority:   Routine    Referral Type:   Consultation    Referral Reason:   Care Coordination    Number of Visits Requested:   1   No images are attached to the encounter or orders placed in the encounter. Meds ordered this encounter  Medications   amLODipine (NORVASC) 5 MG tablet    Sig:  Take 1 tablet (5 mg total) by mouth daily. For high blood pressure.    Dispense:  90 tablet    Refill:  1    Supervising Provider:   SEBASTIAN BEVERLEY NOVAK [[id number]]    Return in about 3 months (around 01/20/2025) for diabetes, high blood pressure.   Rosina Senters, FNP

## 2024-10-20 NOTE — Patient Instructions (Signed)
 HIGH BLOOD PRESSURE:  Take amlodipine 5mg  once daily. Goal blood pressure is 130/80 or less.  Continue to check blood pressure at home.    DIABETES:  I am increasing your Metformin  to 1000mg . Please take this in the morning and evening with food.  I have also sent in Glipizide 5mg . Please take this in the morning with food.

## 2024-10-22 ENCOUNTER — Telehealth: Payer: Self-pay

## 2024-10-22 NOTE — Progress Notes (Signed)
 Complex Care Management Note  Care Guide Note 10/22/2024 Name: Dustin Mcgrath MRN: [id number] DOB: [birth year]  Dustin Mcgrath is a 53 y.o. year old male who sees Billy Knee, FNP for primary care. I reached out to Hendrick Snowman by phone today to offer complex care management services.  [name] was given information about Complex Care Management services today including:   The Complex Care Management services include support from the care team which includes your Nurse Care Manager, Clinical Social Worker, or Pharmacist.  The Complex Care Management team is here to help remove barriers to the health concerns and goals most important to you. Complex Care Management services are voluntary, and the patient may decline or stop services at any time by request to their care team member.   Complex Care Management Consent Status: Patient agreed to services and verbal consent obtained.   Follow up plan:  Telephone appointment with complex care management team member scheduled for:  11/04/24 at 9:00 a.m.   Encounter Outcome:  Patient Scheduled  Dreama [facility]  [facility], [facility] VBCI Assistant Direct Dial: [phone]  Fax: [phone]

## 2024-11-04 ENCOUNTER — Other Ambulatory Visit: Payer: Self-pay

## 2024-11-04 NOTE — Progress Notes (Unsigned)
   11/04/2024  Patient ID: Dustin Mcgrath, male   DOB: 1971/04/20, 53 y.o.   MRN: [id number]  Outreach for scheduled initial telephone visit with PharmD to assist with medication access/adherence/affordability was not successful.  Called and left a HIPAA compliant voicemail x2 along with my direct phone number.  I am sending a Mychart message to see if we can get the visit rescheduled, but will ask the care guide to contact patient to reschedule if I do not hear back.  Channing DELENA Mealing, PharmD, DPLA

## 2024-11-05 ENCOUNTER — Telehealth: Payer: Self-pay

## 2024-11-05 NOTE — Progress Notes (Signed)
 Complex Care Management Care Guide Note  11/05/2024 Name: Dustin Mcgrath MRN: [id number] DOB: 07/03/1971  Dustin Mcgrath is a 53 y.o. year old male who is a primary care patient of Billy Knee, FNP and is actively engaged with the care management team. I reached out to Hendrick Snowman by phone today to assist with re-scheduling  with the Pharmacist.  Follow up plan: Telephone appointment with complex care management team member scheduled for:  11/23/24 at 10:30 a.m.   Dreama [facility]  [facility], [facility] VBCI Assistant Direct Dial: [phone]  Fax: [phone]

## 2024-11-23 ENCOUNTER — Other Ambulatory Visit: Payer: Self-pay

## 2024-11-23 NOTE — Progress Notes (Unsigned)
   11/23/2024  Patient ID: Dustin Mcgrath, male   DOB: Dec 26, 1971, 53 y.o.   MRN: [id number]  Unsuccessful patient outreach for scheduled telephone visit.  I tried to call the patient and left a HIPAA compliant voicemail with  my direct number x2.  I am also sending a MyChart message to attempt to rescheduled and will ask patient navigator to try to contact the patient again if I do not hear back.  Channing DELENA Mealing, PharmD, DPLA

## 2024-12-06 ENCOUNTER — Telehealth: Payer: Self-pay

## 2024-12-06 NOTE — Progress Notes (Unsigned)
 Care Guide Pharmacy Note  12/06/2024 Name: Savior Himebaugh MRN: [id number] DOB: [birth year]  Referred By: Billy Knee, FNP Reason for referral: Complex Care Management and Call Attempt #1 (Unsuccessful initial outreach to schedule with PHARM D- Channing)   Glennie Rodda is a 53 y.o. year old male who is a primary care patient of Billy Knee, FNP.  Enoc Getter was referred to the pharmacist for assistance related to: DMII  An unsuccessful telephone outreach was attempted today to contact the patient who was referred to the pharmacy team for assistance with medication assistance. Additional attempts will be made to contact the patient.  [name] [facility], [facility] Guide  Direct Dial: [phone]  Fax [phone]

## 2024-12-07 NOTE — Progress Notes (Unsigned)
 Care Guide Pharmacy Note  12/07/2024 Name: Dustin Mcgrath: 1971/10/16  Referred By: Billy Knee, FNP Reason for referral: Complex Care Management, Call Attempt #1 (Unsuccessful initial outreach to schedule with PHARM DGLENWOOD Rosella), and Call Attempt #2 (Unsuccessful initial outreach to schedule with PHARM D- Rosella and BSW)   Dustin Mcgrath is a 53 y.o. year old male who is a primary care patient of Billy Knee, FNP.  Dustin Mcgrath was referred to the pharmacist for assistance related to: DMII  A second unsuccessful telephone outreach was attempted today to contact the patient who was referred to the pharmacy team for assistance with medication assistance. Additional attempts will be made to contact the patient.  Dustin Mcgrath [facility], [facility] Guide  Direct Dial: [phone]  Fax [phone]

## 2024-12-08 NOTE — Progress Notes (Signed)
 Care Guide Pharmacy Note  12/08/2024 Name: Dustin Mcgrath MRN: [id number] DOB: 02-27-1971  Referred By: Dustin Knee, Dustin Mcgrath Reason for referral: Complex Care Management, Call Attempt #1 (Unsuccessful initial outreach to schedule with PHARM Dustin Mcgrath), Call Attempt #2 (Unsuccessful initial outreach to schedule with PHARM Dustin Mcgrath and Mcgrath), and Call Attempt #3 (Unsuccessful initial outreach to schedule with PHARM D- Dustin Mcgrath)   Dustin Mcgrath is a 53 y.o. year old male who is a primary care patient of Dustin Knee, Dustin Mcgrath.  Dustin Mcgrath was referred to the pharmacist for assistance related to: DMII  A third unsuccessful telephone outreach was attempted today to contact the patient who was referred to the pharmacy team for assistance with medication assistance. The Population Health team is pleased to engage with this patient at any time in the future upon receipt of referral and should he/she be interested in assistance from the [facility] team.  [name] [facility]  [facility], [facility] Guide  Direct Dial: [phone]  Fax [phone]

## 2025-01-20 ENCOUNTER — Ambulatory Visit: Payer: Self-pay | Admitting: Internal Medicine

## 2025-01-20 NOTE — Progress Notes (Unsigned)
 " [facility] [address] [place] [address] Dept: [phone] Dept Fax: [phone]    Subjective:   Dustin Mcgrath 1971/09/12 01/20/2025  No chief complaint on file.   HPI: Dustin Mcgrath presents today for re-assessment and management of chronic medical conditions.   Lab Results  Component Value Date   HGBA1C 12.3 (H) 10/06/2024   BP Readings from Last 3 Encounters:  10/20/24 131/80  10/06/24 (!) 144/90  08/30/24 (!) 135/90     The following portions of the patient's history were reviewed and updated as appropriate: past medical history, past surgical history, family history, social history, allergies, medications, and problem list.   Patient Active Problem List   Diagnosis Date Noted   Primary hypertension 10/20/2024   Type 2 diabetes mellitus with hyperglycemia, without long-term current use of insulin (HCC) 10/06/2024   No past medical history on file. Past Surgical History:  Procedure Laterality Date   APPENDECTOMY     No family history on file. Current Medications[1] Allergies[1]   ROS: A complete ROS was performed with pertinent positives/negatives noted in the HPI. The remainder of the ROS are negative.    Objective:   There were no vitals filed for this visit.  GENERAL: Well-appearing, in NAD. Well nourished.  SKIN: Pink, warm and dry. No rash, lesion, ulceration, or ecchymoses.  NECK: Trachea midline. Full ROM w/o pain or tenderness. No lymphadenopathy.  RESPIRATORY: Chest wall symmetrical. Respirations even and non-labored. Breath sounds clear to auscultation bilaterally.  CARDIAC: S1, S2 present, regular rate and rhythm. Peripheral pulses 2+ bilaterally.  MSK: Muscle tone and strength appropriate for age. Joints w/o tenderness, redness, or swelling.  EXTREMITIES: Without clubbing, cyanosis, or edema.  NEUROLOGIC: No motor or sensory deficits. Steady, even gait.  PSYCH/MENTAL STATUS:  Alert, oriented x 3. Cooperative, appropriate mood and affect.   Health Maintenance Due  Topic Date Due   OPHTHALMOLOGY EXAM  Never done   HIV Screening  Never done   Diabetic kidney evaluation - Urine ACR  Never done   Hepatitis C Screening  Never done   DTaP/Tdap/Td (1 - Tdap) Never done   Pneumococcal Vaccine: 50+ Years (1 of 2 - PCV) Never done   Hepatitis B Vaccines 19-59 Average Risk (1 of 3 - 19+ 3-dose series) Never done   Colonoscopy  Never done   Zoster Vaccines- Shingrix (1 of 2) Never done   Influenza Vaccine  Never done   COVID-19 Vaccine (1 - 2025-26 season) Never done    No results found for any visits on 01/20/25.  The ASCVD Risk score (Arnett DK, et al., 2019) failed to calculate for the following reasons:   Cannot find a previous HDL lab   Cannot find a previous total cholesterol lab   * - Cholesterol units were assumed     Assessment & Plan:    Type 2 diabetes mellitus with hyperglycemia, without long-term current use of insulin (HCC)  Primary hypertension  Screening for lipid disorders   No orders of the defined types were placed in this encounter.  No images are attached to the encounter or orders placed in the encounter. No orders of the defined types were placed in this encounter.   No follow-ups on file.   Rosina Senters, FNP    [1]  Current Outpatient Medications:    amLODipine  (NORVASC ) 5 MG tablet, Take 1 tablet (5 mg total) by mouth daily. For high blood pressure., Disp: 90 tablet, Rfl: 1  glipiZIDE  (GLUCOTROL  XL) 5 MG 24 hr tablet, Take 1 tablet (5 mg total) by mouth daily with breakfast. (Patient not taking: Reported on 10/20/2024), Disp: 90 tablet, Rfl: 1   metFORMIN  (GLUCOPHAGE ) 1000 MG tablet, Take 1 tablet (1,000 mg total) by mouth 2 (two) times daily with a meal. (Patient not taking: Reported on 10/20/2024), Disp: 180 tablet, Rfl: 1 [1] No Known Allergies  "
